# Patient Record
Sex: Female | Born: 1965 | Race: Black or African American | Hispanic: No | Marital: Married | Smoking: Former smoker
Health system: Southern US, Community
[De-identification: ages and names within clinical notes are randomized; demographics above are authoritative.]

## PROBLEM LIST (undated history)

## (undated) ENCOUNTER — Emergency Department (HOSPITAL_COMMUNITY): Admission: EM | Payer: Medicare Other | Source: Home / Self Care

## (undated) DIAGNOSIS — F410 Panic disorder [episodic paroxysmal anxiety] without agoraphobia: Secondary | ICD-10-CM

## (undated) DIAGNOSIS — J449 Chronic obstructive pulmonary disease, unspecified: Secondary | ICD-10-CM

## (undated) DIAGNOSIS — M549 Dorsalgia, unspecified: Secondary | ICD-10-CM

## (undated) DIAGNOSIS — G4730 Sleep apnea, unspecified: Secondary | ICD-10-CM

## (undated) DIAGNOSIS — I10 Essential (primary) hypertension: Secondary | ICD-10-CM

## (undated) DIAGNOSIS — F209 Schizophrenia, unspecified: Secondary | ICD-10-CM

## (undated) DIAGNOSIS — F419 Anxiety disorder, unspecified: Secondary | ICD-10-CM

## (undated) DIAGNOSIS — F32A Depression, unspecified: Secondary | ICD-10-CM

## (undated) DIAGNOSIS — D649 Anemia, unspecified: Secondary | ICD-10-CM

## (undated) DIAGNOSIS — F329 Major depressive disorder, single episode, unspecified: Secondary | ICD-10-CM

## (undated) DIAGNOSIS — G8929 Other chronic pain: Secondary | ICD-10-CM

## (undated) DIAGNOSIS — M1990 Unspecified osteoarthritis, unspecified site: Secondary | ICD-10-CM

## (undated) HISTORY — PX: CHOLECYSTECTOMY: SHX55

## (undated) HISTORY — PX: PARTIAL HYSTERECTOMY: SHX80

## (undated) HISTORY — DX: Sleep apnea, unspecified: G47.30

---

## 2009-10-21 ENCOUNTER — Encounter: Admission: RE | Admit: 2009-10-21 | Discharge: 2009-10-21 | Payer: Self-pay | Admitting: Internal Medicine

## 2010-03-08 ENCOUNTER — Emergency Department (HOSPITAL_COMMUNITY)
Admission: EM | Admit: 2010-03-08 | Discharge: 2010-03-08 | Payer: Self-pay | Source: Home / Self Care | Admitting: Emergency Medicine

## 2010-03-24 ENCOUNTER — Emergency Department (HOSPITAL_COMMUNITY)
Admission: EM | Admit: 2010-03-24 | Discharge: 2010-03-25 | Payer: Self-pay | Source: Home / Self Care | Admitting: Emergency Medicine

## 2010-03-24 LAB — URINALYSIS, ROUTINE W REFLEX MICROSCOPIC
Bilirubin Urine: NEGATIVE
Hemoglobin, Urine: NEGATIVE
Ketones, ur: NEGATIVE mg/dL
Nitrite: NEGATIVE
Protein, ur: NEGATIVE mg/dL
Specific Gravity, Urine: 1.026 (ref 1.005–1.030)
Urine Glucose, Fasting: NEGATIVE mg/dL
Urobilinogen, UA: 0.2 mg/dL (ref 0.0–1.0)
pH: 5.5 (ref 5.0–8.0)

## 2010-03-24 LAB — BASIC METABOLIC PANEL
BUN: 9 mg/dL (ref 6–23)
CO2: 30 mEq/L (ref 19–32)
Calcium: 9.5 mg/dL (ref 8.4–10.5)
Chloride: 102 mEq/L (ref 96–112)
Creatinine, Ser: 0.78 mg/dL (ref 0.4–1.2)
GFR calc Af Amer: >60 mL/min (ref >60)
GFR calc non Af Amer: >60 mL/min (ref >60)
Glucose, Bld: 98 mg/dL (ref 70–99)
Potassium: 3.8 mEq/L (ref 3.5–5.1)
Sodium: 139 mEq/L (ref 135–145)

## 2010-03-24 LAB — CBC
HCT: 36.8 % (ref 36.0–46.0)
Hemoglobin: 12.1 g/dL (ref 12.0–15.0)
MCH: 30.9 pg (ref 26.0–34.0)
MCHC: 32.9 g/dL (ref 30.0–36.0)
MCV: 93.9 fL (ref 78.0–100.0)
Platelets: 277 10*3/uL (ref 150–400)
RBC: 3.92 MIL/uL (ref 3.87–5.11)
RDW: 13.1 % (ref 11.5–15.5)
WBC: 8.2 10*3/uL (ref 4.0–10.5)

## 2010-03-24 LAB — DIFFERENTIAL
Basophils Absolute: 0 10*3/uL (ref 0.0–0.1)
Basophils Relative: 0 % (ref 0–1)
Eosinophils Absolute: 0.1 10*3/uL (ref 0.0–0.7)
Eosinophils Relative: 1 % (ref 0–5)
Lymphocytes Relative: 31 % (ref 12–46)
Lymphs Abs: 2.5 10*3/uL (ref 0.7–4.0)
Monocytes Absolute: 0.6 10*3/uL (ref 0.1–1.0)
Monocytes Relative: 8 % (ref 3–12)
Neutro Abs: 5 10*3/uL (ref 1.7–7.7)
Neutrophils Relative %: 61 % (ref 43–77)

## 2010-03-24 LAB — POCT CARDIAC MARKERS
CKMB, poc: <1 ng/mL — ABNORMAL LOW (ref 1.0–8.0)
Myoglobin, poc: 52.7 ng/mL (ref 12–200)
Troponin i, poc: <0.05 ng/mL (ref 0.00–0.09)

## 2010-05-06 ENCOUNTER — Other Ambulatory Visit (HOSPITAL_COMMUNITY): Payer: Self-pay | Admitting: Internal Medicine

## 2010-05-06 DIAGNOSIS — Z1231 Encounter for screening mammogram for malignant neoplasm of breast: Secondary | ICD-10-CM

## 2010-05-31 LAB — URINALYSIS, ROUTINE W REFLEX MICROSCOPIC
Bilirubin Urine: NEGATIVE
Glucose, UA: NEGATIVE mg/dL
Hgb urine dipstick: NEGATIVE
Ketones, ur: NEGATIVE mg/dL
Nitrite: NEGATIVE
Protein, ur: NEGATIVE mg/dL
Specific Gravity, Urine: 1.024 (ref 1.005–1.030)
Urobilinogen, UA: 0.2 mg/dL (ref 0.0–1.0)
pH: 6 (ref 5.0–8.0)

## 2010-05-31 LAB — GLUCOSE, CAPILLARY: Glucose-Capillary: 106 mg/dL — ABNORMAL HIGH (ref 70–99)

## 2010-06-02 ENCOUNTER — Other Ambulatory Visit: Payer: Self-pay | Admitting: Internal Medicine

## 2010-06-02 DIAGNOSIS — Z1231 Encounter for screening mammogram for malignant neoplasm of breast: Secondary | ICD-10-CM

## 2010-09-17 ENCOUNTER — Emergency Department (HOSPITAL_COMMUNITY): Payer: Medicare Other

## 2010-09-17 ENCOUNTER — Emergency Department (HOSPITAL_COMMUNITY)
Admission: EM | Admit: 2010-09-17 | Discharge: 2010-09-17 | Disposition: A | Discharge status: Home / Self Care | Payer: Medicare Other | Attending: Emergency Medicine | Admitting: Emergency Medicine

## 2010-09-17 DIAGNOSIS — S0083XA Contusion of other part of head, initial encounter: Secondary | ICD-10-CM | POA: Insufficient documentation

## 2010-09-17 DIAGNOSIS — R51 Headache: Secondary | ICD-10-CM | POA: Insufficient documentation

## 2010-09-17 DIAGNOSIS — S0010XA Contusion of unspecified eyelid and periocular area, initial encounter: Secondary | ICD-10-CM | POA: Insufficient documentation

## 2010-09-17 DIAGNOSIS — S0003XA Contusion of scalp, initial encounter: Secondary | ICD-10-CM | POA: Insufficient documentation

## 2010-10-25 ENCOUNTER — Ambulatory Visit: Payer: Self-pay

## 2010-10-26 ENCOUNTER — Other Ambulatory Visit: Payer: Self-pay | Admitting: Internal Medicine

## 2010-10-26 DIAGNOSIS — N644 Mastodynia: Secondary | ICD-10-CM

## 2010-11-01 ENCOUNTER — Ambulatory Visit: Payer: Self-pay

## 2010-11-03 ENCOUNTER — Other Ambulatory Visit: Payer: Medicare Other

## 2010-11-16 ENCOUNTER — Other Ambulatory Visit: Payer: Medicare Other

## 2010-11-30 ENCOUNTER — Other Ambulatory Visit: Payer: Medicare Other

## 2010-12-06 ENCOUNTER — Ambulatory Visit
Admission: RE | Admit: 2010-12-06 | Discharge: 2010-12-06 | Disposition: A | Discharge status: Home / Self Care | Payer: Medicare Other | Source: Ambulatory Visit | Attending: Internal Medicine | Admitting: Internal Medicine

## 2010-12-06 DIAGNOSIS — Z780 Asymptomatic menopausal state: Secondary | ICD-10-CM

## 2010-12-06 DIAGNOSIS — N644 Mastodynia: Secondary | ICD-10-CM

## 2011-04-04 ENCOUNTER — Other Ambulatory Visit (HOSPITAL_COMMUNITY): Payer: Self-pay | Admitting: Internal Medicine

## 2011-04-27 ENCOUNTER — Other Ambulatory Visit (HOSPITAL_COMMUNITY): Payer: Self-pay | Admitting: Internal Medicine

## 2011-04-27 DIAGNOSIS — R1032 Left lower quadrant pain: Secondary | ICD-10-CM

## 2011-04-28 ENCOUNTER — Other Ambulatory Visit (HOSPITAL_COMMUNITY): Payer: Self-pay | Admitting: Internal Medicine

## 2011-04-28 DIAGNOSIS — R1032 Left lower quadrant pain: Secondary | ICD-10-CM

## 2011-05-04 ENCOUNTER — Inpatient Hospital Stay (HOSPITAL_COMMUNITY): Admission: RE | Admit: 2011-05-04 | Payer: Medicare Other | Source: Ambulatory Visit

## 2011-05-04 ENCOUNTER — Other Ambulatory Visit (HOSPITAL_COMMUNITY): Payer: Medicare Other

## 2011-05-11 ENCOUNTER — Other Ambulatory Visit (HOSPITAL_COMMUNITY): Payer: Medicare Other

## 2011-05-11 ENCOUNTER — Inpatient Hospital Stay (HOSPITAL_COMMUNITY): Admission: RE | Admit: 2011-05-11 | Payer: Medicare Other | Source: Ambulatory Visit

## 2011-05-13 ENCOUNTER — Other Ambulatory Visit (HOSPITAL_COMMUNITY): Payer: Medicare Other

## 2011-06-23 ENCOUNTER — Encounter (HOSPITAL_COMMUNITY): Payer: Self-pay | Admitting: *Deleted

## 2011-06-23 ENCOUNTER — Emergency Department (HOSPITAL_COMMUNITY): Payer: Medicare Other

## 2011-06-23 ENCOUNTER — Emergency Department (HOSPITAL_COMMUNITY)
Admission: EM | Admit: 2011-06-23 | Discharge: 2011-06-23 | Disposition: A | Discharge status: Home / Self Care | Payer: Medicare Other | Attending: Emergency Medicine | Admitting: Emergency Medicine

## 2011-06-23 DIAGNOSIS — G8929 Other chronic pain: Secondary | ICD-10-CM | POA: Insufficient documentation

## 2011-06-23 DIAGNOSIS — S32599A Other specified fracture of unspecified pubis, initial encounter for closed fracture: Secondary | ICD-10-CM

## 2011-06-23 DIAGNOSIS — S32509A Unspecified fracture of unspecified pubis, initial encounter for closed fracture: Secondary | ICD-10-CM | POA: Insufficient documentation

## 2011-06-23 DIAGNOSIS — F341 Dysthymic disorder: Secondary | ICD-10-CM | POA: Insufficient documentation

## 2011-06-23 DIAGNOSIS — X58XXXA Exposure to other specified factors, initial encounter: Secondary | ICD-10-CM | POA: Insufficient documentation

## 2011-06-23 DIAGNOSIS — M549 Dorsalgia, unspecified: Secondary | ICD-10-CM | POA: Insufficient documentation

## 2011-06-23 DIAGNOSIS — M25559 Pain in unspecified hip: Secondary | ICD-10-CM | POA: Insufficient documentation

## 2011-06-23 DIAGNOSIS — IMO0002 Reserved for concepts with insufficient information to code with codable children: Secondary | ICD-10-CM | POA: Insufficient documentation

## 2011-06-23 HISTORY — DX: Depression, unspecified: F32.A

## 2011-06-23 HISTORY — DX: Major depressive disorder, single episode, unspecified: F32.9

## 2011-06-23 HISTORY — DX: Panic disorder (episodic paroxysmal anxiety): F41.0

## 2011-06-23 HISTORY — DX: Other chronic pain: G89.29

## 2011-06-23 HISTORY — DX: Unspecified osteoarthritis, unspecified site: M19.90

## 2011-06-23 HISTORY — DX: Dorsalgia, unspecified: M54.9

## 2011-06-23 HISTORY — DX: Anxiety disorder, unspecified: F41.9

## 2011-06-23 MED ORDER — PERCOCET 5-325 MG PO TABS: 1.0000 | ORAL_TABLET | Freq: Four times a day (QID) | ORAL | Status: AC | PRN

## 2011-06-23 MED ORDER — IBUPROFEN 800 MG PO TABS
800.0000 mg | ORAL_TABLET | Freq: Once | ORAL | Status: AC
  Administered 2011-06-23: 800 mg via ORAL
  Filled 2011-06-23: qty 1

## 2011-06-23 NOTE — Progress Notes (Signed)
Pt states pcp is Dr Rolley Sims Unable to find MD listed

## 2011-06-23 NOTE — ED Notes (Signed)
EMS delivered backpack to room
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
## 2011-06-23 NOTE — Discharge Instructions (Signed)
Please read the information below.  Follow up with your primary care provider.  Call the orthopedist if your pain worsens or is not improving with rest and the prescribed pain medication.  You may return to the ER at any time for worsening condition or any new symptoms that concern you.  Pelvic Fracture, Adult You have a fracture (this means there is a break in the bones) of the pelvis. The pelvis is the ring of bones that make up your hipbones. These are the bones you sit on and the lower part of the spine. It is like a boney ring where your legs attach and which supports your upper body. You have an un-displaced fracture. This means the bones are in good position. The pelvic fracture you have is a simple (uncomplicated) fracture. DIAGNOSIS  X-rays usually diagnose these fractures. TREATMENT  The goals of treating pelvic fractures are to get the bones to heal in a good position. The patient should return to normal activities as soon as possible. Such fractures are often treated with normal bed rest and conservative measures.  HOME CARE INSTRUCTIONS   You should be at bed rest for as long as directed by your caregiver. Following this, you may do usual activities, but avoid strenuous activities for as long as directed by your caregiver.   Only take over-the-counter or prescription medicines for pain, discomfort, or fever as directed by your caregiver.   Bed-rest may also be used for discomfort.   Resume your activities when you are able.   If you develop increased pain or discomfort not relieved with medications, contact your caregiver.   Warning: Do not drive a car or operate a motor vehicle until your caregiver specifically tells you it is safe to do so.  SEEK IMMEDIATE MEDICAL CARE IF:   You feel light-headed or faint, develop chest pain or shortness of breath.   An unexplained oral temperature above 102 F (38.9 C) develops.   You develop blood in the urine or in the stools.   There  is difficulty urinating, and/or having a bowel movement, or pain with these efforts.   There is a difficulty or increased pain with walking.   There is swelling in one or both legs that is not normal.  Document Released: 05/16/2001 Document Revised: 02/24/2011 Document Reviewed: 10/19/2007 [facility] Patient Information 2012 [place], [place].

## 2011-06-23 NOTE — Progress Notes (Signed)
Assisted pt to restroom to void.
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
## 2011-06-23 NOTE — ED Notes (Signed)
Patient transported to X-ray 

## 2011-06-23 NOTE — ED Notes (Signed)
PA at bedside. Pt calm.

## 2011-06-25 NOTE — ED Provider Notes (Signed)
Medical screening examination/treatment/procedure(s) were performed by non-physician practitioner and as supervising physician I was immediately available for consultation/collaboration.   Gavin Pound. Kaydyn Chism, MD 06/25/11 1031

## 2012-02-05 ENCOUNTER — Emergency Department (HOSPITAL_COMMUNITY): Payer: Medicare Other

## 2012-02-05 ENCOUNTER — Encounter (HOSPITAL_COMMUNITY): Payer: Self-pay | Admitting: Emergency Medicine

## 2012-02-05 ENCOUNTER — Emergency Department (HOSPITAL_COMMUNITY)
Admission: EM | Admit: 2012-02-05 | Discharge: 2012-02-05 | Disposition: A | Discharge status: Home / Self Care | Payer: Medicare Other | Attending: Emergency Medicine | Admitting: Emergency Medicine

## 2012-02-05 DIAGNOSIS — M542 Cervicalgia: Secondary | ICD-10-CM | POA: Insufficient documentation

## 2012-02-05 DIAGNOSIS — S00531A Contusion of lip, initial encounter: Secondary | ICD-10-CM

## 2012-02-05 DIAGNOSIS — F410 Panic disorder [episodic paroxysmal anxiety] without agoraphobia: Secondary | ICD-10-CM | POA: Insufficient documentation

## 2012-02-05 DIAGNOSIS — S0003XA Contusion of scalp, initial encounter: Secondary | ICD-10-CM | POA: Insufficient documentation

## 2012-02-05 DIAGNOSIS — F411 Generalized anxiety disorder: Secondary | ICD-10-CM | POA: Insufficient documentation

## 2012-02-05 DIAGNOSIS — M129 Arthropathy, unspecified: Secondary | ICD-10-CM | POA: Insufficient documentation

## 2012-02-05 DIAGNOSIS — F329 Major depressive disorder, single episode, unspecified: Secondary | ICD-10-CM | POA: Insufficient documentation

## 2012-02-05 DIAGNOSIS — R51 Headache: Secondary | ICD-10-CM | POA: Insufficient documentation

## 2012-02-05 DIAGNOSIS — J029 Acute pharyngitis, unspecified: Secondary | ICD-10-CM | POA: Insufficient documentation

## 2012-02-05 DIAGNOSIS — Z23 Encounter for immunization: Secondary | ICD-10-CM | POA: Insufficient documentation

## 2012-02-05 DIAGNOSIS — Z79899 Other long term (current) drug therapy: Secondary | ICD-10-CM | POA: Insufficient documentation

## 2012-02-05 DIAGNOSIS — F3289 Other specified depressive episodes: Secondary | ICD-10-CM | POA: Insufficient documentation

## 2012-02-05 DIAGNOSIS — S0990XA Unspecified injury of head, initial encounter: Secondary | ICD-10-CM

## 2012-02-05 DIAGNOSIS — T07XXXA Unspecified multiple injuries, initial encounter: Secondary | ICD-10-CM

## 2012-02-05 DIAGNOSIS — Z87891 Personal history of nicotine dependence: Secondary | ICD-10-CM | POA: Insufficient documentation

## 2012-02-05 DIAGNOSIS — S1093XA Contusion of unspecified part of neck, initial encounter: Secondary | ICD-10-CM | POA: Insufficient documentation

## 2012-02-05 DIAGNOSIS — G8929 Other chronic pain: Secondary | ICD-10-CM | POA: Insufficient documentation

## 2012-02-05 DIAGNOSIS — M549 Dorsalgia, unspecified: Secondary | ICD-10-CM | POA: Insufficient documentation

## 2012-02-05 DIAGNOSIS — F209 Schizophrenia, unspecified: Secondary | ICD-10-CM | POA: Insufficient documentation

## 2012-02-05 DIAGNOSIS — IMO0002 Reserved for concepts with insufficient information to code with codable children: Secondary | ICD-10-CM | POA: Insufficient documentation

## 2012-02-05 HISTORY — DX: Schizophrenia, unspecified: F20.9

## 2012-02-05 MED ORDER — TETANUS-DIPHTH-ACELL PERTUSSIS 5-2.5-18.5 LF-MCG/0.5 IM SUSP
0.5000 mL | Freq: Once | INTRAMUSCULAR | Status: AC
  Administered 2012-02-05: 0.5 mL via INTRAMUSCULAR
  Filled 2012-02-05: qty 0.5

## 2012-02-05 MED ORDER — TRAMADOL HCL 50 MG PO TABS
50.0000 mg | ORAL_TABLET | Freq: Once | ORAL | Status: AC
  Administered 2012-02-05: 50 mg via ORAL
  Filled 2012-02-05: qty 1
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
## 2012-02-06 NOTE — ED Provider Notes (Signed)
Medical screening examination/treatment/procedure(s) were performed by non-physician practitioner and as supervising physician I was immediately available for consultation/collaboration.  Shadavia Dampier R. Dontay Harm, MD 02/06/12 2032 

## 2012-03-09 ENCOUNTER — Encounter (HOSPITAL_COMMUNITY): Payer: Self-pay | Admitting: Emergency Medicine

## 2012-03-09 ENCOUNTER — Emergency Department (HOSPITAL_COMMUNITY)
Admission: EM | Admit: 2012-03-09 | Discharge: 2012-03-09 | Disposition: A | Discharge status: Home / Self Care | Payer: Medicare Other | Attending: Emergency Medicine | Admitting: Emergency Medicine

## 2012-03-09 ENCOUNTER — Emergency Department (HOSPITAL_COMMUNITY): Payer: Medicare Other

## 2012-03-09 DIAGNOSIS — Z8659 Personal history of other mental and behavioral disorders: Secondary | ICD-10-CM | POA: Insufficient documentation

## 2012-03-09 DIAGNOSIS — Z3202 Encounter for pregnancy test, result negative: Secondary | ICD-10-CM | POA: Insufficient documentation

## 2012-03-09 DIAGNOSIS — Z87891 Personal history of nicotine dependence: Secondary | ICD-10-CM | POA: Insufficient documentation

## 2012-03-09 DIAGNOSIS — Z8739 Personal history of other diseases of the musculoskeletal system and connective tissue: Secondary | ICD-10-CM | POA: Insufficient documentation

## 2012-03-09 DIAGNOSIS — N83202 Unspecified ovarian cyst, left side: Secondary | ICD-10-CM

## 2012-03-09 DIAGNOSIS — N83209 Unspecified ovarian cyst, unspecified side: Secondary | ICD-10-CM | POA: Insufficient documentation

## 2012-03-09 DIAGNOSIS — M25569 Pain in unspecified knee: Secondary | ICD-10-CM | POA: Insufficient documentation

## 2012-03-09 DIAGNOSIS — F411 Generalized anxiety disorder: Secondary | ICD-10-CM | POA: Insufficient documentation

## 2012-03-09 DIAGNOSIS — Z79899 Other long term (current) drug therapy: Secondary | ICD-10-CM | POA: Insufficient documentation

## 2012-03-09 DIAGNOSIS — G8929 Other chronic pain: Secondary | ICD-10-CM | POA: Insufficient documentation

## 2012-03-09 DIAGNOSIS — F209 Schizophrenia, unspecified: Secondary | ICD-10-CM | POA: Insufficient documentation

## 2012-03-09 LAB — URINALYSIS, ROUTINE W REFLEX MICROSCOPIC
Bilirubin Urine: NEGATIVE
Nitrite: NEGATIVE
Specific Gravity, Urine: 1.026 (ref 1.005–1.030)
Urobilinogen, UA: 0.2 mg/dL (ref 0.0–1.0)
pH: 5 (ref 5.0–8.0)

## 2012-03-09 LAB — WET PREP, GENITAL
Trich, Wet Prep: NONE SEEN
WBC, Wet Prep HPF POC: NONE SEEN
Yeast Wet Prep HPF POC: NONE SEEN

## 2012-03-09 LAB — URINE MICROSCOPIC-ADD ON

## 2012-03-09 MED ORDER — MELOXICAM 7.5 MG PO TABS: 7.5000 mg | ORAL_TABLET | Freq: Every day | ORAL | Status: DC

## 2012-03-09 NOTE — ED Provider Notes (Signed)
History     CSN: [id number]  Arrival date & time 03/09/12  0612   First MD Initiated Contact with Patient 03/09/12 [phone]      Chief Complaint  Patient presents with  . Abdominal Pain    (Consider location/radiation/quality/duration/timing/severity/associated sxs/prior treatment) Patient is a 46 y.o. female presenting with abdominal pain. The history is provided by the patient. No language interpreter was used.  Abdominal Pain The primary symptoms of the illness include abdominal pain. Episode onset: 20. The onset of the illness was gradual. The problem has been gradually worsening.  The patient states that she believes she is currently not pregnant. Symptoms associated with the illness do not include chills or back pain.   Pt reports she has pain in left lower abdominal pain.   Pt reports she has had pain since 1984 on and off.   Pt wants ovaries removed.   Pt also reports she has arthritis in her knees Past Medical History  Diagnosis Date  . Arthritis   . Chronic back pain   . Depression   . Anxiety   . Panic attacks   . Schizophrenia     Past Surgical History  Procedure Date  . Cesarean section   . Partial hysterectomy   . Cholecystectomy     No family history on file.  History  Substance Use Topics  . Smoking status: Former Smoker -- 0.0 packs/day    Types: Cigarettes    Quit date: 02/03/2012  . Smokeless tobacco: Never Used  . Alcohol Use: No    OB History    Grav Para Term Preterm Abortions TAB SAB Ect Mult Living                  Review of Systems  Constitutional: Negative for chills.  Gastrointestinal: Positive for abdominal pain.  Musculoskeletal: Negative for back pain.  All other systems reviewed and are negative.    Allergies  Sulfa antibiotics  Home Medications   Current Outpatient Rx  Name  Route  Sig  Dispense  Refill  . QUETIAPINE FUMARATE 100 MG PO TABS   Oral   Take 100 mg by mouth at bedtime.         . TRAZODONE HCL 100  MG PO TABS   Oral   Take 100-200 mg by mouth at bedtime.           BP 127/66  Pulse 76  Temp 98.7 F (37.1 C) (Oral)  Resp 18  SpO2 98%  Physical Exam  Nursing note and vitals reviewed. Constitutional: She is oriented to person, place, and time. She appears well-developed and well-nourished.  HENT:  Head: Normocephalic and atraumatic.  Right Ear: External ear normal.  Left Ear: External ear normal.  Nose: Nose normal.  Mouth/Throat: Oropharynx is clear and moist.  Eyes: Conjunctivae normal are normal. Pupils are equal, round, and reactive to light.  Neck: Normal range of motion. Neck supple.  Cardiovascular: Normal rate and normal heart sounds.   Pulmonary/Chest: Effort normal and breath sounds normal.  Abdominal: Soft. There is tenderness.  Genitourinary: Uterus normal. No vaginal discharge found.       Tender left adnexa  Musculoskeletal: She exhibits tenderness.       bilat knees  No swelling  Neurological: She is alert and oriented to person, place, and time. She has normal reflexes.  Skin: Skin is warm.  Psychiatric: She has a normal mood and affect.    ED Course  Procedures (including critical care time)  Labs Reviewed  URINALYSIS, ROUTINE W REFLEX MICROSCOPIC - Abnormal; Notable for the following:    APPearance CLOUDY (*)     Hgb urine dipstick LARGE (*)     Leukocytes, UA MODERATE (*)     All other components within normal limits  URINE MICROSCOPIC-ADD ON - Abnormal; Notable for the following:    Squamous Epithelial / LPF FEW (*)     Bacteria, UA FEW (*)     All other components within normal limits  PREGNANCY, URINE  WET PREP, GENITAL  GC/CHLAMYDIA PROBE AMP  URINE CULTURE   US Transvaginal Non-ob  03/09/2012  *RADIOLOGY REPORT*  Clinical Data: Left lower quadrant pain. Uterus removed.  Previous cyst.  TRANSABDOMINAL AND TRANSVAGINAL ULTRASOUND OF PELVIS Technique:  Both transabdominal and transvaginal ultrasound examinations of the pelvis were  performed. Transabdominal technique was performed for global imaging of the pelvis including uterus, ovaries, adnexal regions, and pelvic cul-de-sac.  It was necessary to proceed with endovaginal exam following the transabdominal exam to visualize the adnexa.  Comparison:  None  Findings:  Uterus: Removed.  Calcification vaginal cuff.  Right ovary:  3.1 x 2.0 x 1.7 cm.  No dominant mass.  Left ovary: 5.0 x 3.2 x 3.6 cm.  Two cysts/follicles measuring up to 2.2 cm.  Other findings: Trace  IMPRESSION: Post hysterectomy.  Calcification of the vaginal cuff incidentally noted.  No worrisome adnexal abnormality.  Left ovary contains two small cysts or follicles.   Original Report Authenticated By: Lacy Duverney, M.D.    US Pelvis Complete  03/09/2012  *RADIOLOGY REPORT*  Clinical Data: Left lower quadrant pain. Uterus removed.  Previous cyst.  TRANSABDOMINAL AND TRANSVAGINAL ULTRASOUND OF PELVIS Technique:  Both transabdominal and transvaginal ultrasound examinations of the pelvis were performed. Transabdominal technique was performed for global imaging of the pelvis including uterus, ovaries, adnexal regions, and pelvic cul-de-sac.  It was necessary to proceed with endovaginal exam following the transabdominal exam to visualize the adnexa.  Comparison:  None  Findings:  Uterus: Removed.  Calcification vaginal cuff.  Right ovary:  3.1 x 2.0 x 1.7 cm.  No dominant mass.  Left ovary: 5.0 x 3.2 x 3.6 cm.  Two cysts/follicles measuring up to 2.2 cm.  Other findings: Trace  IMPRESSION: Post hysterectomy.  Calcification of the vaginal cuff incidentally noted.  No worrisome adnexal abnormality.  Left ovary contains two small cysts or follicles.   Original Report Authenticated By: Lacy Duverney, M.D.      1. Left ovarian cyst   2. Bilateral chronic knee pain       MDM  Meloxicam,   Referral to gyn clinic for evaluation        Lonia Skinner [place], [place] 03/09/12 [phone]

## 2012-03-09 NOTE — ED Notes (Signed)
Pt brought to ED by [facility] with abdominal pain mainly on the rt side.

## 2012-03-09 NOTE — ED Notes (Signed)
Pt returned from US

## 2012-03-09 NOTE — ED Notes (Signed)
Pt brought to ED by [facility] with abdominal pain more on the rt side.

## 2012-03-09 NOTE — ED Provider Notes (Signed)
Medical screening examination/treatment/procedure(s) were performed by non-physician practitioner and as supervising physician I was immediately available for consultation/collaboration.  Leslee Home, M.D.   Reuben Likes, MD 03/09/12 [phone]

## 2012-03-09 NOTE — ED Notes (Signed)
Pt transported to US

## 2012-03-09 NOTE — ED Notes (Signed)
Pt c/o lump on the back of rt side of neck, says its painful. Small nodule noted.

## 2012-03-10 LAB — URINE CULTURE
Colony Count: NO GROWTH
Culture: NO GROWTH

## 2012-03-10 LAB — GC/CHLAMYDIA PROBE AMP
CT Probe RNA: NEGATIVE
GC Probe RNA: NEGATIVE

## 2012-03-30 ENCOUNTER — Ambulatory Visit: Payer: Medicare Other | Admitting: Family Medicine

## 2012-04-03 ENCOUNTER — Ambulatory Visit (HOSPITAL_COMMUNITY)
Admission: RE | Admit: 2012-04-03 | Discharge: 2012-04-03 | Disposition: A | Discharge status: Home / Self Care | Payer: Medicare Other | Source: Ambulatory Visit | Attending: Family Medicine | Admitting: Family Medicine

## 2012-04-03 ENCOUNTER — Other Ambulatory Visit: Payer: Self-pay | Admitting: Internal Medicine

## 2012-04-03 ENCOUNTER — Encounter: Payer: Self-pay | Admitting: Family Medicine

## 2012-04-03 ENCOUNTER — Ambulatory Visit (INDEPENDENT_AMBULATORY_CARE_PROVIDER_SITE_OTHER): Payer: Medicare Other | Admitting: Family Medicine

## 2012-04-03 VITALS — BP 133/82 | HR 120 | Temp 98.3°F | Ht 62.5 in | Wt 267.0 lb

## 2012-04-03 DIAGNOSIS — Z8669 Personal history of other diseases of the nervous system and sense organs: Secondary | ICD-10-CM | POA: Insufficient documentation

## 2012-04-03 DIAGNOSIS — M1710 Unilateral primary osteoarthritis, unspecified knee: Secondary | ICD-10-CM

## 2012-04-03 DIAGNOSIS — Z1231 Encounter for screening mammogram for malignant neoplasm of breast: Secondary | ICD-10-CM

## 2012-04-03 DIAGNOSIS — R079 Chest pain, unspecified: Secondary | ICD-10-CM | POA: Insufficient documentation

## 2012-04-03 DIAGNOSIS — F419 Anxiety disorder, unspecified: Secondary | ICD-10-CM | POA: Insufficient documentation

## 2012-04-03 DIAGNOSIS — R0789 Other chest pain: Secondary | ICD-10-CM

## 2012-04-03 DIAGNOSIS — F209 Schizophrenia, unspecified: Secondary | ICD-10-CM

## 2012-04-03 DIAGNOSIS — Z0000 Encounter for general adult medical examination without abnormal findings: Secondary | ICD-10-CM

## 2012-04-03 DIAGNOSIS — M170 Bilateral primary osteoarthritis of knee: Secondary | ICD-10-CM | POA: Insufficient documentation

## 2012-04-03 DIAGNOSIS — F341 Dysthymic disorder: Secondary | ICD-10-CM

## 2012-04-03 DIAGNOSIS — Z6841 Body Mass Index (BMI) 40.0 and over, adult: Secondary | ICD-10-CM

## 2012-04-03 DIAGNOSIS — M79602 Pain in left arm: Secondary | ICD-10-CM | POA: Insufficient documentation

## 2012-04-03 DIAGNOSIS — G8929 Other chronic pain: Secondary | ICD-10-CM | POA: Insufficient documentation

## 2012-04-03 DIAGNOSIS — M79609 Pain in unspecified limb: Secondary | ICD-10-CM

## 2012-04-03 DIAGNOSIS — F32A Depression, unspecified: Secondary | ICD-10-CM | POA: Insufficient documentation

## 2012-04-03 DIAGNOSIS — R519 Headache, unspecified: Secondary | ICD-10-CM | POA: Insufficient documentation

## 2012-04-03 DIAGNOSIS — Z79899 Other long term (current) drug therapy: Secondary | ICD-10-CM

## 2012-04-03 DIAGNOSIS — R51 Headache: Secondary | ICD-10-CM

## 2012-04-03 DIAGNOSIS — G4730 Sleep apnea, unspecified: Secondary | ICD-10-CM

## 2012-04-03 DIAGNOSIS — F329 Major depressive disorder, single episode, unspecified: Secondary | ICD-10-CM | POA: Insufficient documentation

## 2012-04-03 MED ORDER — DICLOFENAC SODIUM 1 % TD GEL: 4.0000 g | Freq: Four times a day (QID) | TRANSDERMAL | Status: DC

## 2012-04-03 NOTE — Assessment & Plan Note (Signed)
Mammogram information given
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
## 2012-04-03 NOTE — Assessment & Plan Note (Signed)
Prescribed Voltaren gel for osteoarthritis.

## 2012-04-03 NOTE — Patient Instructions (Addendum)
It was nice meeting you today.  I am getting lab work.  You need to make an appointment for your lab work as the lab is currently closed.  In regards to your headaches/dizziness: I'm unsure of the underlying etiology at this time.  Please follow up with me regarding this problem  In regards to your chest discomfort and left arm pain: I am getting an EKG.  When I see you back we will talk about cardiology referral.  Please set up your mammogram appointment.  Please follow up with me in 1-2 months.

## 2012-04-03 NOTE — Assessment & Plan Note (Addendum)
Patient's reports of chest pain are consistent with MSK origin.  Does not appear cardiac in origin (is not substernal, not associated with exertion, or relieved by rest).  Patient is a poor historian and has multiple somatic complaints as well.    EKG was obtained today - NSR with no ST or t wave changes.  Will continue to follow. Patient requesting Cardiology referral.

## 2012-04-05 ENCOUNTER — Other Ambulatory Visit: Payer: Medicare Other

## 2012-04-05 DIAGNOSIS — Z79899 Other long term (current) drug therapy: Secondary | ICD-10-CM

## 2012-04-05 LAB — CBC
MCH: 30.2 pg (ref 26.0–34.0)
MCV: 90.3 fL (ref 78.0–100.0)
Platelets: 336 10*3/uL (ref 150–400)
RDW: 13.1 % (ref 11.5–15.5)
WBC: 6.7 10*3/uL (ref 4.0–10.5)

## 2012-04-05 NOTE — Progress Notes (Signed)
CMP,CBC AND D-LDL DONE TODAY Charle Clear 

## 2012-04-06 LAB — COMPLETE METABOLIC PANEL WITH GFR
ALT: 12 U/L (ref 0–35)
AST: 17 U/L (ref 0–37)
Creat: 0.62 mg/dL (ref 0.50–1.10)
Sodium: 137 mEq/L (ref 135–145)
Total Bilirubin: 0.3 mg/dL (ref 0.3–1.2)
Total Protein: 6.9 g/dL (ref 6.0–8.3)

## 2012-04-20 ENCOUNTER — Encounter (HOSPITAL_COMMUNITY): Payer: Self-pay

## 2012-04-20 ENCOUNTER — Emergency Department (HOSPITAL_COMMUNITY)
Admission: EM | Admit: 2012-04-20 | Discharge: 2012-04-20 | Disposition: A | Discharge status: Home / Self Care | Payer: Medicare Other | Attending: Emergency Medicine | Admitting: Emergency Medicine

## 2012-04-20 DIAGNOSIS — F209 Schizophrenia, unspecified: Secondary | ICD-10-CM | POA: Insufficient documentation

## 2012-04-20 DIAGNOSIS — R0600 Dyspnea, unspecified: Secondary | ICD-10-CM

## 2012-04-20 DIAGNOSIS — G4730 Sleep apnea, unspecified: Secondary | ICD-10-CM | POA: Insufficient documentation

## 2012-04-20 DIAGNOSIS — F411 Generalized anxiety disorder: Secondary | ICD-10-CM | POA: Insufficient documentation

## 2012-04-20 DIAGNOSIS — Z79899 Other long term (current) drug therapy: Secondary | ICD-10-CM | POA: Insufficient documentation

## 2012-04-20 DIAGNOSIS — R5381 Other malaise: Secondary | ICD-10-CM | POA: Insufficient documentation

## 2012-04-20 DIAGNOSIS — R0609 Other forms of dyspnea: Secondary | ICD-10-CM | POA: Insufficient documentation

## 2012-04-20 DIAGNOSIS — Z87820 Personal history of traumatic brain injury: Secondary | ICD-10-CM | POA: Insufficient documentation

## 2012-04-20 DIAGNOSIS — Z87891 Personal history of nicotine dependence: Secondary | ICD-10-CM | POA: Insufficient documentation

## 2012-04-20 DIAGNOSIS — Z8739 Personal history of other diseases of the musculoskeletal system and connective tissue: Secondary | ICD-10-CM | POA: Insufficient documentation

## 2012-04-20 DIAGNOSIS — F3289 Other specified depressive episodes: Secondary | ICD-10-CM | POA: Insufficient documentation

## 2012-04-20 DIAGNOSIS — R0989 Other specified symptoms and signs involving the circulatory and respiratory systems: Secondary | ICD-10-CM | POA: Insufficient documentation

## 2012-04-20 DIAGNOSIS — F329 Major depressive disorder, single episode, unspecified: Secondary | ICD-10-CM | POA: Insufficient documentation

## 2012-04-20 DIAGNOSIS — M129 Arthropathy, unspecified: Secondary | ICD-10-CM | POA: Insufficient documentation

## 2012-04-20 DIAGNOSIS — R42 Dizziness and giddiness: Secondary | ICD-10-CM | POA: Insufficient documentation

## 2012-04-20 DIAGNOSIS — G479 Sleep disorder, unspecified: Secondary | ICD-10-CM | POA: Insufficient documentation

## 2012-04-20 LAB — CBC WITH DIFFERENTIAL/PLATELET
Eosinophils Relative: 1 % (ref 0–5)
HCT: 34 % — ABNORMAL LOW (ref 36.0–46.0)
Hemoglobin: 11.2 g/dL — ABNORMAL LOW (ref 12.0–15.0)
Lymphocytes Relative: 39 % (ref 12–46)
Lymphs Abs: 2.1 10*3/uL (ref 0.7–4.0)
MCV: 92.6 fL (ref 78.0–100.0)
Monocytes Absolute: 0.4 10*3/uL (ref 0.1–1.0)
Monocytes Relative: 7 % (ref 3–12)
Platelets: 291 10*3/uL (ref 150–400)
RBC: 3.67 MIL/uL — ABNORMAL LOW (ref 3.87–5.11)
WBC: 5.3 10*3/uL (ref 4.0–10.5)

## 2012-04-20 LAB — BASIC METABOLIC PANEL
BUN: 10 mg/dL (ref 6–23)
CO2: 25 mEq/L (ref 19–32)
Calcium: 9 mg/dL (ref 8.4–10.5)
Glucose, Bld: 89 mg/dL (ref 70–99)
Sodium: 139 mEq/L (ref 135–145)

## 2012-04-20 MED ORDER — QUETIAPINE FUMARATE 100 MG PO TABS: 100.0000 mg | ORAL_TABLET | Freq: Every day | ORAL | Status: DC

## 2012-04-20 MED ORDER — SODIUM CHLORIDE 0.9 % IV BOLUS (SEPSIS)
1000.0000 mL | Freq: Once | INTRAVENOUS | Status: AC
  Administered 2012-04-20: 1000 mL via INTRAVENOUS

## 2012-04-20 NOTE — ED Notes (Signed)
Pt. oob to the bathroom, gait steady. Pt. Wants to leave to go to school. Informed Dr. Jeraldine Loots.
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
## 2012-04-24 ENCOUNTER — Ambulatory Visit (INDEPENDENT_AMBULATORY_CARE_PROVIDER_SITE_OTHER): Payer: Medicare Other | Admitting: Family Medicine

## 2012-04-24 ENCOUNTER — Other Ambulatory Visit (HOSPITAL_COMMUNITY)
Admission: RE | Admit: 2012-04-24 | Discharge: 2012-04-24 | Disposition: A | Discharge status: Home / Self Care | Payer: Medicare Other | Source: Ambulatory Visit | Attending: Family Medicine | Admitting: Family Medicine

## 2012-04-24 ENCOUNTER — Encounter: Payer: Self-pay | Admitting: Family Medicine

## 2012-04-24 VITALS — BP 127/87 | HR 87 | Temp 96.9°F | Wt 259.9 lb

## 2012-04-24 DIAGNOSIS — Z202 Contact with and (suspected) exposure to infections with a predominantly sexual mode of transmission: Secondary | ICD-10-CM

## 2012-04-24 DIAGNOSIS — Z0000 Encounter for general adult medical examination without abnormal findings: Secondary | ICD-10-CM

## 2012-04-24 DIAGNOSIS — Z124 Encounter for screening for malignant neoplasm of cervix: Secondary | ICD-10-CM

## 2012-04-24 DIAGNOSIS — Z20828 Contact with and (suspected) exposure to other viral communicable diseases: Secondary | ICD-10-CM

## 2012-04-24 DIAGNOSIS — Z113 Encounter for screening for infections with a predominantly sexual mode of transmission: Secondary | ICD-10-CM | POA: Insufficient documentation

## 2012-04-24 DIAGNOSIS — Z1151 Encounter for screening for human papillomavirus (HPV): Secondary | ICD-10-CM | POA: Insufficient documentation

## 2012-04-24 DIAGNOSIS — Z2089 Contact with and (suspected) exposure to other communicable diseases: Secondary | ICD-10-CM

## 2012-04-24 DIAGNOSIS — N760 Acute vaginitis: Secondary | ICD-10-CM

## 2012-04-24 LAB — POCT WET PREP (WET MOUNT)

## 2012-04-24 MED ORDER — METRONIDAZOLE 500 MG PO TABS: 500.0000 mg | ORAL_TABLET | Freq: Two times a day (BID) | ORAL | Status: DC

## 2012-04-24 NOTE — Patient Instructions (Addendum)
I will call/or mail you a letter with the results of your STD testing.  Please return for the remainder of your lab tests at your earliest convenience (the lab closes after 5 pm).  We will address some of your concerns at your next visit.  Please see me for follow up in 1-3 months.
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
## 2012-04-24 NOTE — Assessment & Plan Note (Signed)
Pap smear performed today.  Patient has had prior hysterectomy but given unclear history, pap smear obtained today.  Will obtain old records and further discuss history of cervical cancer with patient.   If negative, no need for further pap smears.

## 2012-04-24 NOTE — Assessment & Plan Note (Addendum)
Wet prep obtained - positive for BV and Trichomonas.  Called patient and informed her of diagnosis.  Also sent in prescription for 7 day course of flagyl. Awaiting GC and chlamydia. Patient to return for HIV and RPR

## 2012-04-26 ENCOUNTER — Encounter: Payer: Medicare Other | Admitting: Obstetrics & Gynecology

## 2012-04-29 ENCOUNTER — Emergency Department (HOSPITAL_COMMUNITY)
Admission: EM | Admit: 2012-04-29 | Discharge: 2012-04-29 | Disposition: A | Discharge status: Home / Self Care | Payer: Medicare Other | Attending: Emergency Medicine | Admitting: Emergency Medicine

## 2012-04-29 ENCOUNTER — Encounter (HOSPITAL_COMMUNITY): Payer: Self-pay | Admitting: Emergency Medicine

## 2012-04-29 DIAGNOSIS — Z79899 Other long term (current) drug therapy: Secondary | ICD-10-CM | POA: Insufficient documentation

## 2012-04-29 DIAGNOSIS — E669 Obesity, unspecified: Secondary | ICD-10-CM | POA: Insufficient documentation

## 2012-04-29 DIAGNOSIS — Z8739 Personal history of other diseases of the musculoskeletal system and connective tissue: Secondary | ICD-10-CM | POA: Insufficient documentation

## 2012-04-29 DIAGNOSIS — F209 Schizophrenia, unspecified: Secondary | ICD-10-CM | POA: Insufficient documentation

## 2012-04-29 DIAGNOSIS — R42 Dizziness and giddiness: Secondary | ICD-10-CM | POA: Insufficient documentation

## 2012-04-29 DIAGNOSIS — R51 Headache: Secondary | ICD-10-CM | POA: Insufficient documentation

## 2012-04-29 DIAGNOSIS — F411 Generalized anxiety disorder: Secondary | ICD-10-CM | POA: Insufficient documentation

## 2012-04-29 DIAGNOSIS — Z87891 Personal history of nicotine dependence: Secondary | ICD-10-CM | POA: Insufficient documentation

## 2012-04-29 DIAGNOSIS — M1710 Unilateral primary osteoarthritis, unspecified knee: Secondary | ICD-10-CM | POA: Insufficient documentation

## 2012-04-29 DIAGNOSIS — R5381 Other malaise: Secondary | ICD-10-CM | POA: Insufficient documentation

## 2012-04-29 DIAGNOSIS — G4730 Sleep apnea, unspecified: Secondary | ICD-10-CM | POA: Insufficient documentation

## 2012-04-29 LAB — CBC WITH DIFFERENTIAL/PLATELET
Eosinophils Relative: 2 % (ref 0–5)
HCT: 34.7 % — ABNORMAL LOW (ref 36.0–46.0)
Hemoglobin: 11.3 g/dL — ABNORMAL LOW (ref 12.0–15.0)
Lymphocytes Relative: 40 % (ref 12–46)
Lymphs Abs: 2 10*3/uL (ref 0.7–4.0)
MCV: 92 fL (ref 78.0–100.0)
Monocytes Absolute: 0.4 10*3/uL (ref 0.1–1.0)
Neutro Abs: 2.6 10*3/uL (ref 1.7–7.7)
RBC: 3.77 MIL/uL — ABNORMAL LOW (ref 3.87–5.11)
WBC: 5.1 10*3/uL (ref 4.0–10.5)

## 2012-04-29 LAB — COMPREHENSIVE METABOLIC PANEL
ALT: 16 U/L (ref 0–35)
CO2: 28 mEq/L (ref 19–32)
Calcium: 8.6 mg/dL (ref 8.4–10.5)
Chloride: 109 mEq/L (ref 96–112)
Creatinine, Ser: 0.66 mg/dL (ref 0.50–1.10)
GFR calc Af Amer: >90 mL/min (ref >90)
GFR calc non Af Amer: >90 mL/min (ref >90)
Glucose, Bld: 86 mg/dL (ref 70–99)
Total Bilirubin: 0.2 mg/dL — ABNORMAL LOW (ref 0.3–1.2)

## 2012-04-29 MED ORDER — MECLIZINE HCL 25 MG PO TABS
25.0000 mg | ORAL_TABLET | Freq: Once | ORAL | Status: AC
  Administered 2012-04-29: 25 mg via ORAL
  Filled 2012-04-29: qty 1

## 2012-04-29 MED ORDER — MECLIZINE HCL 25 MG PO TABS: 25.0000 mg | ORAL_TABLET | Freq: Three times a day (TID) | ORAL | Status: DC | PRN

## 2012-04-29 NOTE — ED Notes (Signed)
Pt has been seen here recently for weakness/dizziness and rcvd negative head CT.  Pt was given a refill of seroquil on Fri and states she continues to fill dizzy.  She thinks maybe she needs her seroquil prescription to be changed (feels as if dizziness/weakness began when she started taking seroquil and has gotten worse when she was off seroquil and has not improved since she started taking it again).  Pt requesting food and told to await MD.

## 2012-04-29 NOTE — ED Provider Notes (Signed)
History     CSN: [id number]  Arrival date & time 04/29/12  1047   First MD Initiated Contact with Patient 04/29/12 1243      Chief Complaint  Patient presents with  . Dizziness  . Weakness    (Consider location/radiation/quality/duration/timing/severity/associated sxs/prior treatment) HPI Comments: Patient with history of schizophrenia, obesity.  Presents with complaints of headache and dizziness intermittently for the past year, worse the past two weeks.  She was started on seroquel to help her sleep about the same time.  She ran out of this two weeks ago and has felt more dizzy and having headaches since that time.  She was started back on it two days ago.  No chest pain or shortness of breath.  She has been seen for this before and had workups which have been unremarkable.   Patient is a 47 y.o. female presenting with weakness. The history is provided by the patient.  Weakness This is a chronic problem. Episode onset: several months ago. The problem occurs constantly. The problem has been gradually worsening. Associated symptoms include headaches. Pertinent negatives include no chest pain, no abdominal pain and no shortness of breath. Nothing aggravates the symptoms. Nothing relieves the symptoms. She has tried nothing for the symptoms.    Past Medical History  Diagnosis Date  . Arthritis   . Chronic back pain   . Depression   . Anxiety   . Panic attacks   . Schizophrenia   . Sleep apnea     Past Surgical History  Procedure Laterality Date  . Cesarean section    . Partial hysterectomy    . Cholecystectomy      Family History  Problem Relation Age of Onset  . Alcohol abuse Father   . Heart disease Father   . Diabetes Mellitus II Brother   . Diabetes Mellitus II Father   . Stroke Sister   . Osteoporosis Mother     History  Substance Use Topics  . Smoking status: Former Smoker -- 0.02 packs/day    Types: Cigarettes    Quit date: 02/03/2012  . Smokeless tobacco:  Never Used  . Alcohol Use: No    OB History   Grav Para Term Preterm Abortions TAB SAB Ect Mult Living                  Review of Systems  Constitutional: Positive for fatigue. Negative for appetite change.  Respiratory: Negative for shortness of breath.   Cardiovascular: Negative for chest pain.  Gastrointestinal: Negative for abdominal pain.  Neurological: Positive for weakness and headaches.  All other systems reviewed and are negative.    Allergies  Sulfa antibiotics  Home Medications   Current Outpatient Rx  Name  Route  Sig  Dispense  Refill  . diclofenac sodium (VOLTAREN) 1 % GEL   Topical   Apply 4 g topically 4 (four) times daily. For knee pain         . meloxicam (MOBIC) 7.5 MG tablet   Oral   Take 7.5 mg by mouth daily as needed. For pain and inflammation         . metroNIDAZOLE (FLAGYL) 500 MG tablet   Oral   Take 500 mg by mouth 2 (two) times daily. Started course today         . QUEtiapine (SEROQUEL) 100 MG tablet   Oral   Take 100 mg by mouth at bedtime.           BP 145/128  Pulse 69  Temp(Src) 97.9 F (36.6 C) (Oral)  Resp 19  SpO2 98%  Physical Exam  Nursing note and vitals reviewed. Constitutional: She is oriented to person, place, and time. She appears well-developed and well-nourished.  Obese female in no distress.   HENT:  Head: Normocephalic and atraumatic.  Eyes: EOM are normal. Pupils are equal, round, and reactive to light.  Neck: Normal range of motion. Neck supple.  Cardiovascular: Normal rate and regular rhythm.  Exam reveals no gallop and no friction rub.   No murmur heard. Pulmonary/Chest: Effort normal and breath sounds normal. No respiratory distress. She has no wheezes.  Abdominal: Soft. Bowel sounds are normal. She exhibits no distension. There is no tenderness.  Musculoskeletal: Normal range of motion.  Neurological: She is alert and oriented to person, place, and time. No cranial nerve deficit. She exhibits  normal muscle tone. Coordination normal.  Skin: Skin is warm and dry. She is not diaphoretic.    ED Course  Procedures (including critical care time)  Labs Reviewed  CBC WITH DIFFERENTIAL  COMPREHENSIVE METABOLIC PANEL   No results found.   No diagnosis found.   Date: 04/29/2012  Rate: 66  Rhythm: normal sinus rhythm  QRS Axis: normal  Intervals: normal  ST/T Wave abnormalities: normal  Conduction Disutrbances:none  Narrative Interpretation:   Old EKG Reviewed: unchanged    MDM  The patient presents with dizziness that may well be related to her seroquel.  The symptoms seem to coincide with this being started and stopped.  No other causative pathology was identified today.  She is stable for discharge to discuss her medications with her pcp.          Geoffery Lyons, MD 04/29/12 1438

## 2012-04-29 NOTE — ED Notes (Signed)
Per EMS: pt from home c/o dizziness and generalized weakness x 1 year after starting to take seroquel; pt seen here on multiple occasions for same with negative workup

## 2012-05-01 ENCOUNTER — Ambulatory Visit
Admission: RE | Admit: 2012-05-01 | Discharge: 2012-05-01 | Disposition: A | Discharge status: Home / Self Care | Payer: Medicare Other | Source: Ambulatory Visit | Attending: Internal Medicine | Admitting: Internal Medicine

## 2012-05-01 DIAGNOSIS — Z1231 Encounter for screening mammogram for malignant neoplasm of breast: Secondary | ICD-10-CM

## 2012-05-04 ENCOUNTER — Encounter (HOSPITAL_COMMUNITY): Payer: Self-pay | Admitting: Emergency Medicine

## 2012-05-04 ENCOUNTER — Emergency Department (HOSPITAL_COMMUNITY)
Admission: EM | Admit: 2012-05-04 | Discharge: 2012-05-04 | Disposition: A | Discharge status: Home / Self Care | Payer: Medicare Other | Attending: Emergency Medicine | Admitting: Emergency Medicine

## 2012-05-04 DIAGNOSIS — Z8709 Personal history of other diseases of the respiratory system: Secondary | ICD-10-CM | POA: Insufficient documentation

## 2012-05-04 DIAGNOSIS — Z79899 Other long term (current) drug therapy: Secondary | ICD-10-CM | POA: Insufficient documentation

## 2012-05-04 DIAGNOSIS — G8929 Other chronic pain: Secondary | ICD-10-CM | POA: Insufficient documentation

## 2012-05-04 DIAGNOSIS — F410 Panic disorder [episodic paroxysmal anxiety] without agoraphobia: Secondary | ICD-10-CM | POA: Insufficient documentation

## 2012-05-04 DIAGNOSIS — R42 Dizziness and giddiness: Secondary | ICD-10-CM | POA: Insufficient documentation

## 2012-05-04 DIAGNOSIS — M549 Dorsalgia, unspecified: Secondary | ICD-10-CM | POA: Insufficient documentation

## 2012-05-04 DIAGNOSIS — F209 Schizophrenia, unspecified: Secondary | ICD-10-CM | POA: Insufficient documentation

## 2012-05-04 DIAGNOSIS — F329 Major depressive disorder, single episode, unspecified: Secondary | ICD-10-CM | POA: Insufficient documentation

## 2012-05-04 DIAGNOSIS — Z8739 Personal history of other diseases of the musculoskeletal system and connective tissue: Secondary | ICD-10-CM | POA: Insufficient documentation

## 2012-05-04 DIAGNOSIS — Z87891 Personal history of nicotine dependence: Secondary | ICD-10-CM | POA: Insufficient documentation

## 2012-05-04 DIAGNOSIS — M5380 Other specified dorsopathies, site unspecified: Secondary | ICD-10-CM | POA: Insufficient documentation

## 2012-05-04 DIAGNOSIS — F411 Generalized anxiety disorder: Secondary | ICD-10-CM | POA: Insufficient documentation

## 2012-05-04 DIAGNOSIS — F3289 Other specified depressive episodes: Secondary | ICD-10-CM | POA: Insufficient documentation

## 2012-05-04 MED ORDER — MECLIZINE HCL 50 MG PO TABS: 50.0000 mg | ORAL_TABLET | Freq: Two times a day (BID) | ORAL | Status: DC

## 2012-05-04 NOTE — ED Notes (Signed)
Pt. Is eating  A box lunch

## 2012-05-04 NOTE — ED Notes (Signed)
MD at bedside. 
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
## 2012-05-04 NOTE — ED Notes (Signed)
Dizziness and back spasms since last Spring.

## 2012-05-07 NOTE — ED Provider Notes (Signed)
Medical screening examination/treatment/procedure(s) were performed by non-physician practitioner and as supervising physician I was immediately available for consultation/collaboration.  Travante Knee, MD 05/07/12 0023 

## 2012-05-17 ENCOUNTER — Encounter: Payer: Medicare Other | Admitting: Obstetrics & Gynecology

## 2012-05-28 ENCOUNTER — Ambulatory Visit (INDEPENDENT_AMBULATORY_CARE_PROVIDER_SITE_OTHER): Payer: Medicare Other | Admitting: Family Medicine

## 2012-05-28 ENCOUNTER — Encounter: Payer: Self-pay | Admitting: Family Medicine

## 2012-05-28 ENCOUNTER — Other Ambulatory Visit: Payer: Self-pay | Admitting: *Deleted

## 2012-05-28 VITALS — BP 139/80 | HR 74 | Ht 62.5 in | Wt 254.0 lb

## 2012-05-28 DIAGNOSIS — M170 Bilateral primary osteoarthritis of knee: Secondary | ICD-10-CM

## 2012-05-28 DIAGNOSIS — Z20828 Contact with and (suspected) exposure to other viral communicable diseases: Secondary | ICD-10-CM

## 2012-05-28 DIAGNOSIS — Z6841 Body Mass Index (BMI) 40.0 and over, adult: Secondary | ICD-10-CM

## 2012-05-28 DIAGNOSIS — R42 Dizziness and giddiness: Secondary | ICD-10-CM

## 2012-05-28 DIAGNOSIS — Z113 Encounter for screening for infections with a predominantly sexual mode of transmission: Secondary | ICD-10-CM

## 2012-05-28 MED ORDER — MELOXICAM 7.5 MG PO TABS: 7.5000 mg | ORAL_TABLET | Freq: Every day | ORAL | Status: DC | PRN

## 2012-05-28 NOTE — Assessment & Plan Note (Signed)
Unclear etiology of dizziness. DDX - BPPV (unlikely given negative Dix hallpike), Presyncope (patient does not endorse any symptoms of this nature), Vertigo, Psychiatric related. Will continue to follow.  Will continue meclizine for now and monitor for improvement.

## 2012-05-28 NOTE — Assessment & Plan Note (Signed)
Discussed diet and exercise today.  Patient states she has plans to start exercising in the near future. Will continue to follow.  Possible referral to Dr. Gerilyn Pilgrim if patient desires.

## 2012-05-28 NOTE — Assessment & Plan Note (Signed)
HIV and RPR today. 

## 2012-05-28 NOTE — Patient Instructions (Addendum)
Diet Recommendations:  Increase amount of fruits and veggies. Eat lean meats (chicken, fish, lean beef). Increase intake of whole grains.  Try to cut back on fast foods, and fatty foods.   Exercise recommendations: Try to exercise at least 30 mins a day, 3 times a week.   I have refilled your mobic.  I'm unsure of the cause of your dizziness. Please continue to take your Meclizine.  I will call you with the results of your blood tests.

## 2012-05-28 NOTE — Assessment & Plan Note (Signed)
Mobic refilled.

## 2012-05-28 NOTE — Progress Notes (Signed)
Subjective:     Patient ID: Rachel Alvarado, female   DOB: 12-14-1965, 47 y.o.   MRN: [id number]  HPI Ms. Hoak presents to the clinic today for follow up.    1) Screening for STD's - Ms Klahn was recently seen in the clinic for this and did not return for HIV and RPR - Requesting blood work today  2) Medication refill - Patient has history of osteoarthritis - Requesting medication refill of Mobic  3) Obesity - Wants to discuss weight loss today - Admits to poor diet (fatty foods predominantly) and infrequent exercise  4) Dizziness - States this has been bothering her for approximately 1 year - Describes is as a "rush of blood" going to her head.  It is intermittent occuring almost daily. - Denies feelings of syncope. - Reports it is associated with movement. - Improves but does not resolve with meclizine. - No associated hearing loss or headache.    Review of Systems See HPI    Objective:   Physical Exam Filed Vitals:   05/28/12 1623  BP: 139/80  Pulse: 74   Gen: well appearing. NAD. HEENT: Dix Hallpike negative. Heart: RRR. No murmurs, rubs, or gallops. Lungs: CTAB. No rales, rhonchi, or wheeze. Abdomen: soft, nontender, nondistended. Extremities: no edema.     Assessment:        Plan:

## 2012-05-29 ENCOUNTER — Encounter: Payer: Self-pay | Admitting: Family Medicine

## 2012-05-29 LAB — HIV ANTIBODY (ROUTINE TESTING W REFLEX): HIV: NONREACTIVE

## 2012-06-06 ENCOUNTER — Encounter: Payer: Medicare Other | Admitting: Obstetrics & Gynecology

## 2012-06-07 ENCOUNTER — Ambulatory Visit: Payer: Medicare Other | Admitting: Family Medicine

## 2012-06-28 ENCOUNTER — Encounter: Payer: Medicare Other | Admitting: Obstetrics & Gynecology

## 2012-07-03 ENCOUNTER — Ambulatory Visit: Payer: Medicare Other | Admitting: Family Medicine

## 2012-07-23 ENCOUNTER — Ambulatory Visit (INDEPENDENT_AMBULATORY_CARE_PROVIDER_SITE_OTHER): Payer: Medicare Other | Admitting: Obstetrics & Gynecology

## 2012-07-23 ENCOUNTER — Ambulatory Visit (INDEPENDENT_AMBULATORY_CARE_PROVIDER_SITE_OTHER): Payer: Medicare Other | Admitting: Family Medicine

## 2012-07-23 ENCOUNTER — Encounter: Payer: Self-pay | Admitting: Obstetrics & Gynecology

## 2012-07-23 VITALS — BP 154/88 | HR 73 | Temp 97.6°F | Ht 64.0 in | Wt 248.2 lb

## 2012-07-23 VITALS — BP 155/93 | HR 81 | Ht 65.0 in | Wt 248.0 lb

## 2012-07-23 DIAGNOSIS — Z87898 Personal history of other specified conditions: Secondary | ICD-10-CM

## 2012-07-23 DIAGNOSIS — Z8669 Personal history of other diseases of the nervous system and sense organs: Secondary | ICD-10-CM

## 2012-07-23 DIAGNOSIS — N949 Unspecified condition associated with female genital organs and menstrual cycle: Secondary | ICD-10-CM

## 2012-07-23 DIAGNOSIS — Z0000 Encounter for general adult medical examination without abnormal findings: Secondary | ICD-10-CM

## 2012-07-23 DIAGNOSIS — R102 Pelvic and perineal pain: Secondary | ICD-10-CM

## 2012-07-23 MED ORDER — CALCIUM CARBONATE-VIT D-MIN 600-400 MG-UNIT PO TABS: 2.0000 | ORAL_TABLET | Freq: Every day | ORAL | Status: DC

## 2012-07-23 NOTE — Assessment & Plan Note (Signed)
Patient started on Calcium and Vitamin D supplementation today to ensure adequate calcium and vitamin D intake/stores.

## 2012-07-23 NOTE — Patient Instructions (Addendum)
It was nice seeing you today.  We will get the records regarding your sleep apnea and then get you a CPAP machine.  Please take your supplements for bone health as prescribed.  Follow up with me in 3-6 months or earlier as needed.

## 2012-07-23 NOTE — Progress Notes (Signed)
Subjective:     Patient ID: Rachel Alvarado, female   DOB: 22-Jun-1965, 47 y.o.   MRN: [id number]  HPI Pt presents with request to have left ovary removed.  'it has given me problems since i was 47 years old and I want it taken out'.  Pt denies any pain or problems currently.  Her last sonogram was Dec 2013.  She denies problems since that time.  She denies abnormal bleeding or discharge.   Past Medical History  Diagnosis Date  . Arthritis   . Chronic back pain   . Depression   . Anxiety   . Panic attacks   . Schizophrenia   . Sleep apnea    Past Surgical History  Procedure Laterality Date  . Cesarean section    . Partial hysterectomy    . Cholecystectomy     Current Outpatient Prescriptions on File Prior to Visit  Medication Sig Dispense Refill  . diclofenac sodium (VOLTAREN) 1 % GEL Apply 4 g topically 4 (four) times daily. For knee pain      . meloxicam (MOBIC) 7.5 MG tablet Take 1 tablet (7.5 mg total) by mouth daily as needed. For pain and inflammation  30 tablet  3  . QUEtiapine (SEROQUEL) 100 MG tablet Take 300 mg by mouth at bedtime.       . meclizine (ANTIVERT) 25 MG tablet Take 25 mg by mouth 3 (three) times daily as needed for dizziness.      . meclizine (ANTIVERT) 50 MG tablet Take 1 tablet (50 mg total) by mouth 2 (two) times daily.  14 tablet  0  . metroNIDAZOLE (FLAGYL) 500 MG tablet Take 500 mg by mouth 2 (two) times daily. Started course today       No current facility-administered medications on file prior to visit.   Allergies  Allergen Reactions  . Sulfa Antibiotics Hives      Review of Systems     Objective:   Physical Exam BP 154/88  Pulse 73  Temp(Src) 97.6 F (36.4 C) (Oral)  Ht 5\' 4"  (1.626 m)  Wt 248 lb 3.2 oz (112.583 kg)  BMI 42.58 kg/m2 Pt declined exam as she is having no acute problems  03/09/2012 TRANSABDOMINAL AND TRANSVAGINAL ULTRASOUND OF PELVIS  Technique: Both transabdominal and transvaginal ultrasound  examinations of the  pelvis were performed. Transabdominal technique  was performed for global imaging of the pelvis including uterus,  ovaries, adnexal regions, and pelvic cul-de-sac.  It was necessary to proceed with endovaginal exam following the  transabdominal exam to visualize the adnexa.  Comparison: None  Findings:  Uterus: Removed. Calcification vaginal cuff.  Right ovary: 3.1 x 2.0 x 1.7 cm. No dominant mass.  Left ovary: 5.0 x 3.2 x 3.6 cm. Two cysts/follicles measuring up  to 2.2 cm.  Other findings: Trace  IMPRESSION:  Post hysterectomy. Calcification of the vaginal cuff incidentally  noted.  No worrisome adnexal abnormality. Left ovary contains two small  cysts or follicles.      Assessment:     Physiologic cyst/follicle previously on ovary in pt nearing menopause    Plan:     F/u prn Educated on normal physiologic cysts Educated on changes assoc with menopause

## 2012-07-23 NOTE — Patient Instructions (Signed)
Perimenopause Perimenopause is the time when your body begins to move into the menopause (no menstrual period for 12 straight months). It is a natural process. Perimenopause can begin 2 to 8 years before the menopause and usually lasts for one year after the menopause. During this time, your ovaries may or may not produce an egg. The ovaries vary in their production of estrogen and progesterone hormones each month. This can cause irregular menstrual periods, difficulty in getting pregnant, vaginal bleeding between periods and uncomfortable symptoms. CAUSES  Irregular production of the ovarian hormones, estrogen and progesterone, and not ovulating every month.  Other causes include:  Tumor of the pituitary gland in the brain.  Medical disease that affects the ovaries.  Radiation treatment.  Chemotherapy.  Unknown causes.  Heavy smoking and excessive alcohol intake can bring on perimenopause sooner. SYMPTOMS   Hot flashes.  Night sweats.  Irregular menstrual periods.  Decrease sex drive.  Vaginal dryness.  Headaches.  Mood swings.  Depression.  Memory problems.  Irritability.  Tiredness.  Weight gain.  Trouble getting pregnant.  The beginning of losing bone cells (osteoporosis).  The beginning of hardening of the arteries (atherosclerosis). DIAGNOSIS  Your caregiver will make a diagnosis by analyzing your age, menstrual history and your symptoms. They will do a physical exam noting any changes in your body, especially your female organs. Female hormone tests may or may not be helpful depending on the amount and when you produce the female hormones. However, other hormone tests may be helpful (ex. thyroid hormone) to rule out other problems. TREATMENT  The decision to treat during the perimenopause should be made by you and your caregiver depending on how the symptoms are affecting you and your life style. There are various treatments available such as:  Treating  individual symptoms with a specific medication for that symptom (ex. tranquilizer for depression).  Herbal medications that can help specific symptoms.  Counseling.  Group therapy.  No treatment. HOME CARE INSTRUCTIONS   Before seeing your caregiver, make a list of your menstrual periods (when the occur, how heavy they are, how long between periods and how long they last), your symptoms and when they started.  Take the medication as recommended by your caregiver.  Sleep and rest.  Exercise.  Eat a diet that contains calcium (good for your bones) and soy (acts like estrogen hormone).  Do not smoke.  Avoid alcoholic beverages.  Taking vitamin E may help in certain cases.  Take calcium and vitamin D supplements to help prevent bone loss.  Group therapy is sometimes helpful.  Acupuncture may help in some cases. SEEK MEDICAL CARE IF:   You have any of the above and want to know if it is perimenopause.  You want advice and treatment for any of your symptoms mentioned above.  You need a referral to a specialist (gynecologist, psychiatrist or psychologist). SEEK IMMEDIATE MEDICAL CARE IF:   You have vaginal bleeding.  Your period lasts longer than 8 days.  You periods are recurring sooner than 21 days.  You have bleeding after intercourse.  You have severe depression.  You have pain when you urinate.  You have severe headaches.  You develop vision problems. Document Released: 04/14/2004 Document Revised: 05/30/2011 Document Reviewed: 01/03/2008 [facility] Patient Information 2013 [place], [place]. Menopause Menopause is the normal time of life when menstrual periods stop completely. Menopause is complete when you have missed 12 consecutive menstrual periods. It usually occurs between the ages of 22 to 26, with an  average age of 9. Very rarely does a woman develop menopause before 48 years old. At menopause, your ovaries stop producing the female hormones, estrogen and  progesterone. This can cause undesirable symptoms and also affect your health. Sometimes the symptoms may occur 4 to 5 years before the menopause begins. There is no relationship between menopause and:  Oral contraceptives.  Number of children you had.  Race.  The age your menstrual periods started (menarche). Heavy smokers and very thin women may develop menopause earlier in life. CAUSES  The ovaries stop producing the female hormones estrogen and progesterone.  Other causes include:  Surgery to remove both ovaries.  The ovaries stop functioning for no known reason.  Tumors of the pituitary gland in the brain.  Medical disease that affects the ovaries and hormone production.  Radiation treatment to the abdomen or pelvis.  Chemotherapy that affects the ovaries. SYMPTOMS   Hot flashes.  Night sweats.  Decrease in sex drive.  Vaginal dryness and thinning of the vagina causing painful intercourse.  Dryness of the skin and developing wrinkles.  Headaches.  Tiredness.  Irritability.  Memory problems.  Weight gain.  Bladder infections.  Hair growth of the face and chest.  Infertility. More serious symptoms include:  Loss of bone (osteoporosis) causing breaks (fractures).  Depression.  Hardening and narrowing of the arteries (atherosclerosis) causing heart attacks and strokes. DIAGNOSIS   When the menstrual periods have stopped for 12 straight months.  Physical exam.  Hormone studies of the blood. TREATMENT  There are many treatment choices and nearly as many questions about them. The decisions to treat or not to treat menopausal changes is an individual choice made with your caregiver. Your caregiver can discuss the treatments with you. Together, you can decide which treatment will work best for you. Your treatment choices may include:   Hormone therapy (estorgen and progesterone).  Non-hormonal medications.  Treating the individual symptoms with  medication (for example antidepressants for depression).  Herbal medications that may help specific symptoms.  Counseling by a psychiatrist or psychologist.  Group therapy.  Lifestyle changes including:  Eating healthy.  Regular exercise.  Limiting caffeine and alcohol.  Stress management and meditation.  No treatment. HOME CARE INSTRUCTIONS   Take the medication your caregiver gives you as directed.  Get plenty of sleep and rest.  Exercise regularly.  Eat a diet that contains calcium (good for the bones) and soy products (acts like estrogen hormone).  Avoid alcoholic beverages.  Do not smoke.  If you have hot flashes, dress in layers.  Take supplements, calcium and vitamin D to strengthen bones.  You can use over-the-counter lubricants or moisturizers for vaginal dryness.  Group therapy is sometimes very helpful.  Acupuncture may be helpful in some cases. SEEK MEDICAL CARE IF:   You are not sure you are in menopause.  You are having menopausal symptoms and need advice and treatment.  You are still having menstrual periods after age 23.  You have pain with intercourse.  Menopause is complete (no menstrual period for 12 months) and you develop vaginal bleeding.  You need a referral to a specialist (gynecologist, psychiatrist or psychologist) for treatment. SEEK IMMEDIATE MEDICAL CARE IF:   You have severe depression.  You have excessive vaginal bleeding.  You fell and think you have a broken bone.  You have pain when you urinate.  You develop leg or chest pain.  You have a fast pounding heart beat (palpitations).  You have severe headaches.  You develop vision problems.  You feel a lump in your breast.  You have abdominal pain or severe indigestion. Document Released: 05/28/2003 Document Revised: 05/30/2011 Document Reviewed: 01/03/2008 [facility] Patient Information 2013 [place], [place].

## 2012-07-23 NOTE — Progress Notes (Signed)
Subjective:     Patient ID: Rachel Alvarado, female   DOB: April 07, 1965, 47 y.o.   MRN: [id number]  HPI [name] presents to the clinic today for follow up.  1) Hx of Sleep apnea - She reports prior history/diagnosis of sleep apnea.  Diagnosis was made in [place] (at [facility]).  - She states that she was on CPAP, but has not had a one for years. - She is concerned that her "brain is not receiving enough oxygen." She is also concerned about frequent yawning during the day. - She denies excessive daytime sleepiness and states that she is awake/alert and high functioning during the day.  She does, however, report loud snoring which she has had for years.    2) Concern for Osteoporosis/poor bone health - Patient is quite perseverative about this. - She states that she has brittle bones and needs to be on calcium supplementation. - She states that her neck pops/cracks frequently and she has had several fractures in the past. - No recent fall or trauma.  Patient has had a DEXA scan in the past (2012).   Review of Systems Per HPI    Objective:   Physical Exam Filed Vitals:   07/23/12 1558  BP: 155/93  Pulse: 81  General: well appearing obese female in NAD. Psych: Anxious. Normal affect. Patient continues to worry/perseverate about various things, insisting further work up and treatment.      Assessment:     See prob list    Plan:

## 2012-07-23 NOTE — Assessment & Plan Note (Signed)
Will obtain records from hospital in [place] where sleep study was done. If evidence of sleep apnea, will send in Rx for CPAP.   If unable to obtain records will send patient for sleep study.

## 2012-08-14 ENCOUNTER — Telehealth: Payer: Self-pay | Admitting: Family Medicine

## 2012-08-14 DIAGNOSIS — Z8669 Personal history of other diseases of the nervous system and sense organs: Secondary | ICD-10-CM

## 2012-08-14 NOTE — Telephone Encounter (Signed)
Will fwd to MD for information.  Tanika Bracco, Darlyne Russian, CMA

## 2012-08-14 NOTE — Telephone Encounter (Signed)
Pt is wondering if medical records have been received from [place] hospital. Wants to know what is taking so long. Please advise

## 2012-08-16 NOTE — Telephone Encounter (Signed)
Medical records received. No record of Sleep study from [facility].   Will order patient's sleep study.  [facility] will be in contact.

## 2012-08-20 NOTE — Telephone Encounter (Signed)
When attempting to contact the patient for the Sleep Apnea appointment, please call [phone] or [phone].

## 2012-09-19 ENCOUNTER — Ambulatory Visit (HOSPITAL_BASED_OUTPATIENT_CLINIC_OR_DEPARTMENT_OTHER): Payer: Medicare Other | Attending: Family Medicine | Admitting: Radiology

## 2012-09-19 VITALS — Ht 65.0 in | Wt 243.0 lb

## 2012-09-19 DIAGNOSIS — G4761 Periodic limb movement disorder: Secondary | ICD-10-CM | POA: Insufficient documentation

## 2012-09-19 DIAGNOSIS — R351 Nocturia: Secondary | ICD-10-CM | POA: Insufficient documentation

## 2012-09-19 DIAGNOSIS — G4733 Obstructive sleep apnea (adult) (pediatric): Secondary | ICD-10-CM

## 2012-09-19 DIAGNOSIS — Z8669 Personal history of other diseases of the nervous system and sense organs: Secondary | ICD-10-CM

## 2012-09-22 DIAGNOSIS — G4761 Periodic limb movement disorder: Secondary | ICD-10-CM

## 2012-09-22 DIAGNOSIS — R351 Nocturia: Secondary | ICD-10-CM

## 2012-09-22 DIAGNOSIS — G4733 Obstructive sleep apnea (adult) (pediatric): Secondary | ICD-10-CM

## 2012-09-23 NOTE — Procedures (Signed)
Rachel Alvarado, Rachel Alvarado                ACCOUNT NO.:  [id number]  MEDICAL RECORD NO.:  [id number]          PATIENT TYPE:  OUT  LOCATION:  SLEEP CENTER                 FACILITY:  [facility]  PHYSICIAN:  Kyan Giannone D. Maple Hudson, MD, FCCP, FACPDATE OF BIRTH:  01-07-1965  DATE OF STUDY:  09/19/2012                           NOCTURNAL POLYSOMNOGRAM  REFERRING PHYSICIAN:  Nestor Ramp, MD  INDICATION FOR STUDY:  Insomnia with sleep apnea.  EPWORTH SLEEPINESS SCORE:  7/24.  BMI 40.3.  Weight 242 pounds.  Height 65 inches.  Neck 16.5 inches.  MEDICATIONS:  Home medications charted for review.  SLEEP ARCHITECTURE:  Split study protocol.  During the diagnostic phase, total sleep time 120.5 minutes with sleep efficiency 62.4%.  Stage I was 10.4%, stage II 89.6%, stages III and REM were absent.  Sleep latency 13.5 minutes, awake after sleep onset 59 minutes.  Arousal index 87.6. Bedtime medications:  Seroquel.  RESPIRATORY DATA:  Split study protocol.  Apnea/hypopnea index (AHI) 32.9 per hour.  A total of 66 events was scored including 33 obstructive apneas, 4 central apneas, 4 mixed apneas, 25 hypopneas.  Events were seen in all sleep positions, particularly while supine.  CPAP was titrated to 19 CWP, AHI 0 per hour.  She wore a medium ResMed Quattro FX full-face mask with heated humidifier.  OXYGEN DATA:  Moderate snoring before CPAP with oxygen desaturation to a nadir of 87% on room air.  With CPAP titration, snoring was prevented and mean oxygen saturation held 97.1% on room air.  CARDIAC DATA:  Normal sinus rhythm.  MOVEMENT/PARASOMNIA:  During the diagnostic phase, a total of 61 limb jerks were counted, of which 35 were associated with arousals or awakening for periodic limb movement with arousal index of 17.4 per hour.  During the titration phase, only 14 events were counted of which 4 were associated with arousal or awakening for periodic limb movement with arousal index of 1.5 per hour.   Bathroom x4.  IMPRESSION/RECOMMENDATION: 1. Severe obstructive sleep apnea/hypopnea syndrome, AHI 32.9 per hour     with mainly supine events.  Moderate snoring with oxygen     desaturation to a nadir of 87% on room air. 2. Successful CPAP titration to 19 CWP, AHI 0 per hour.  She wore a     medium ResMed Quattro FX full-face mask with heated humidifier.     Snoring was prevented and mean oxygen saturation held 97.1% on room     air with CPAP. 3. Periodic limb movement with arousal, significantly more frequent     before CPAP.  This suggests that most of     the events were actually respiratory related arousals. 4. Sleep was additionally interrupted by nocturia x4.     [name], MD, [facility], FACP Diplomate, American Board of Sleep Medicine    CDY/MEDQ  D:  09/22/2012 10:29:17  T:  09/23/2012 01:10:19  Job:  [id number]

## 2012-09-26 ENCOUNTER — Other Ambulatory Visit: Payer: Self-pay | Admitting: Family Medicine

## 2012-09-26 DIAGNOSIS — G4733 Obstructive sleep apnea (adult) (pediatric): Secondary | ICD-10-CM

## 2013-02-19 ENCOUNTER — Telehealth: Payer: Self-pay | Admitting: Radiology

## 2013-02-19 NOTE — Telephone Encounter (Signed)
[facility] called today, they need NPI number for Dr Milus Glazier, on the order, I am unsure how to do this, and get it to [facility] help

## 2013-04-01 ENCOUNTER — Other Ambulatory Visit: Payer: Self-pay

## 2013-04-01 DIAGNOSIS — Z1231 Encounter for screening mammogram for malignant neoplasm of breast: Secondary | ICD-10-CM

## 2013-05-06 ENCOUNTER — Ambulatory Visit
Admission: RE | Admit: 2013-05-06 | Discharge: 2013-05-06 | Disposition: A | Discharge status: Home / Self Care | Payer: Medicaid Other | Source: Ambulatory Visit

## 2013-05-06 DIAGNOSIS — Z1231 Encounter for screening mammogram for malignant neoplasm of breast: Secondary | ICD-10-CM

## 2013-05-17 ENCOUNTER — Encounter (HOSPITAL_COMMUNITY): Payer: Self-pay | Admitting: Emergency Medicine

## 2013-05-17 ENCOUNTER — Emergency Department (HOSPITAL_COMMUNITY)
Admission: EM | Admit: 2013-05-17 | Discharge: 2013-05-17 | Discharge status: Against Medical Advice | Payer: Medicare Other | Attending: Emergency Medicine | Admitting: Emergency Medicine

## 2013-05-17 DIAGNOSIS — R1110 Vomiting, unspecified: Secondary | ICD-10-CM | POA: Insufficient documentation

## 2013-05-17 DIAGNOSIS — R197 Diarrhea, unspecified: Secondary | ICD-10-CM | POA: Insufficient documentation

## 2013-05-17 DIAGNOSIS — R109 Unspecified abdominal pain: Secondary | ICD-10-CM | POA: Insufficient documentation

## 2013-05-17 DIAGNOSIS — Z87891 Personal history of nicotine dependence: Secondary | ICD-10-CM | POA: Insufficient documentation

## 2013-05-17 DIAGNOSIS — G8929 Other chronic pain: Secondary | ICD-10-CM | POA: Insufficient documentation

## 2013-05-17 MED ORDER — ONDANSETRON 8 MG PO TBDP
8.0000 mg | ORAL_TABLET | Freq: Once | ORAL | Status: AC
Start: 2013-05-17 — End: 2013-05-17
  Administered 2013-05-17: 8 mg via ORAL
  Filled 2013-05-17: qty 1

## 2013-05-17 NOTE — ED Notes (Signed)
Per EMS-states patient ate potatoe salad last night and 2 hours later became sick-went to urgent care and got there too late so she called EMS to take her to ED

## 2013-05-17 NOTE — ED Notes (Signed)
Called 2x to draw labs - no answer.

## 2013-05-17 NOTE — ED Notes (Signed)
Pt called x 3 for room no answer 

## 2013-07-28 ENCOUNTER — Emergency Department (HOSPITAL_COMMUNITY)
Admission: EM | Admit: 2013-07-28 | Discharge: 2013-07-28 | Disposition: A | Discharge status: Home / Self Care | Payer: Medicare Other | Attending: Emergency Medicine | Admitting: Emergency Medicine

## 2013-07-28 ENCOUNTER — Encounter (HOSPITAL_COMMUNITY): Payer: Self-pay | Admitting: Emergency Medicine

## 2013-07-28 ENCOUNTER — Emergency Department (HOSPITAL_COMMUNITY): Payer: Medicare Other

## 2013-07-28 DIAGNOSIS — F3289 Other specified depressive episodes: Secondary | ICD-10-CM | POA: Insufficient documentation

## 2013-07-28 DIAGNOSIS — J441 Chronic obstructive pulmonary disease with (acute) exacerbation: Secondary | ICD-10-CM | POA: Insufficient documentation

## 2013-07-28 DIAGNOSIS — F209 Schizophrenia, unspecified: Secondary | ICD-10-CM

## 2013-07-28 DIAGNOSIS — Z79899 Other long term (current) drug therapy: Secondary | ICD-10-CM | POA: Insufficient documentation

## 2013-07-28 DIAGNOSIS — F411 Generalized anxiety disorder: Secondary | ICD-10-CM | POA: Insufficient documentation

## 2013-07-28 DIAGNOSIS — G4733 Obstructive sleep apnea (adult) (pediatric): Secondary | ICD-10-CM | POA: Insufficient documentation

## 2013-07-28 DIAGNOSIS — G8929 Other chronic pain: Secondary | ICD-10-CM | POA: Insufficient documentation

## 2013-07-28 DIAGNOSIS — Z87891 Personal history of nicotine dependence: Secondary | ICD-10-CM | POA: Insufficient documentation

## 2013-07-28 DIAGNOSIS — Z8739 Personal history of other diseases of the musculoskeletal system and connective tissue: Secondary | ICD-10-CM | POA: Insufficient documentation

## 2013-07-28 DIAGNOSIS — F329 Major depressive disorder, single episode, unspecified: Secondary | ICD-10-CM | POA: Insufficient documentation

## 2013-07-28 DIAGNOSIS — R0600 Dyspnea, unspecified: Secondary | ICD-10-CM

## 2013-07-28 HISTORY — DX: Chronic obstructive pulmonary disease, unspecified: J44.9

## 2013-07-28 NOTE — ED Provider Notes (Signed)
CSN: [id number]     Arrival date & time 07/28/13  0334 History   First MD Initiated Contact with Patient 07/28/13 0502     Chief Complaint  Patient presents with  . Shortness of Breath     (Consider location/radiation/quality/duration/timing/severity/associated sxs/prior Treatment) Patient is a 48 y.o. female presenting with shortness of breath. The history is provided by the patient.  Shortness of Breath She brings in paperwork stating that she has a diagnosis of obstructive sleep apnea and states that she wants help getting the machine to use for her. She states that she got the machine but didn't think she needed it so she saw that and now Medicare will not get the machine for her. She states that her breathing has been worse over the last 2 days. There is no cough no chest pain. There is no fever or chills. There's been no vomiting or diarrhea. Her thought process is quite disjointed and it is difficult to get her to focus on what I am asking her. She states that she does have [facility] and she is also under the care of a psychiatrist.  Past Medical History  Diagnosis Date  . Arthritis   . Chronic back pain   . Depression   . Anxiety   . Panic attacks   . Schizophrenia   . Sleep apnea   . COPD (chronic obstructive pulmonary disease)    Past Surgical History  Procedure Laterality Date  . Cesarean section    . Partial hysterectomy    . Cholecystectomy     Family History  Problem Relation Age of Onset  . Alcohol abuse Father   . Heart disease Father   . Diabetes Mellitus II Brother   . Diabetes Mellitus II Father   . Stroke Sister   . Osteoporosis Mother    History  Substance Use Topics  . Smoking status: Former Smoker -- 0.02 packs/day    Types: Cigarettes    Quit date: 02/03/2012  . Smokeless tobacco: Never Used  . Alcohol Use: No   OB History   Grav Para Term Preterm Abortions TAB SAB Ect Mult Living                 Review of Systems   Respiratory: Positive for shortness of breath.   All other systems reviewed and are negative.     Allergies  Sulfa antibiotics  Home Medications   Prior to Admission medications   Medication Sig Start Date End Date Taking? Authorizing Provider  cyclobenzaprine (FLEXERIL) 10 MG tablet Take 10 mg by mouth 2 (two) times daily.   Yes Historical Provider, MD  meloxicam (MOBIC) 7.5 MG tablet Take 1 tablet (7.5 mg total) by mouth daily as needed. For pain and inflammation 05/28/12  Yes Jayce G Cook, DO  QUEtiapine (SEROQUEL) 300 MG tablet Take 150 mg by mouth at bedtime.   Yes Historical Provider, MD  sertraline (ZOLOFT) 50 MG tablet Take 50 mg by mouth daily.   Yes Historical Provider, MD   BP 114/51  Pulse 87  Temp(Src) 98.6 F (37 C) (Oral)  Resp 18  SpO2 97% Physical Exam  Nursing note and vitals reviewed.  48 year old female, resting comfortably and in no acute distress. Vital signs are normal. Oxygen saturation is 97%, which is normal. Head is normocephalic and atraumatic. PERRLA, EOMI. Oropharynx is clear. Neck is nontender and supple without adenopathy or JVD. Back is nontender and there is no CVA  tenderness. Lungs are clear without rales, wheezes, or rhonchi. Chest is nontender. Heart has regular rate and rhythm without murmur. Abdomen is soft, flat, nontender without masses or hepatosplenomegaly and peristalsis is normoactive. Extremities have no cyanosis or edema, full range of motion is present. Skin is warm and dry without rash. Neurologic: She is awake, alert, oriented x3, cranial nerves are intact, there are no motor or sensory deficits. Psychiatric: She has disjointed thought process with flight of ideas. She denies homicidal or suicidal ideation.  ED Course  Procedures (including critical care time) Imaging Review Dg Chest 2 View  07/28/2013   CLINICAL DATA:  Shortness of breath.  Anxiety attack.  03/24/2010  EXAM: CHEST  2 VIEW  COMPARISON:  03/24/2010   FINDINGS: Chronic mild cardiomegaly. No edema, consolidation, effusion, or pneumothorax. Cholecystectomy changes. Spondylotic spurring in the lower thoracic spine.  IMPRESSION: No active cardiopulmonary disease.   Electronically Signed   By: Tiburcio PeaJonathan  Watts M.D.   On: 07/28/2013 05:54     EKG Interpretation   Date/Time:  Sunday Jul 28 2013 05:21:05 EDT Ventricular Rate:  76 PR Interval:  170 QRS Duration: 81 QT Interval:  397 QTC Calculation: 446 R Axis:   62 Text Interpretation:  Sinus rhythm Normal ECG When compared with ECG of  04/20/2012, No significant change was found Confirmed by [facility]  MD, Larue Lightner  ([id number]) on 07/28/2013 5:24:00 AM      MDM   Final diagnoses:  Dyspnea  Obstructive sleep apnea  Schizophrenia    Subjective dyspnea in patient with history of obstructive sleep apnea. Old records are reviewed and the sleep study last January does diagnose her with severe obstructive sleep apnea. However, I cannot help her with obtaining the CPAP machine. This have to be done through her PCP. In order to try and address some of her concerns, chest x-ray and ECG will be obtained. She is reassured that her oxygen levels are normal by pulse oximetry and she is requesting that I check blood to be sure that it is accurate but I have assured her that there is no need for blood tests today. She will need to see her psychiatrist to see if she is on appropriate medications for her schizophrenia.  Chest chest x-ray and ECG are normal. Patient is reassured of benign findings and is referred back to PCP regarding whether she would qualify for CPAP machine at home.  Dione Boozeavid Ozias Dicenzo, MD 07/28/13 [phone]

## 2013-07-28 NOTE — ED Notes (Signed)
MD at bedside. 

## 2013-07-28 NOTE — ED Notes (Signed)
Pt states she want to speak with the MD about helping her get a breathing machine; pt stats she has hx of SOB and for past two days breathing has become more difficult.

## 2013-07-28 NOTE — Discharge Instructions (Signed)
Your ECG and chest x-ray were normal. The machine for sleep apnea needs to be arranged through your PCP. Please make an appointment with your PCP and to be seen as soon as possible. He should also make an appointment to see her psychiatrist to make sure that all of your medications are at the optimum dose.

## 2013-09-21 ENCOUNTER — Encounter (HOSPITAL_COMMUNITY): Payer: Self-pay | Admitting: Emergency Medicine

## 2013-09-21 ENCOUNTER — Emergency Department (HOSPITAL_COMMUNITY)
Admission: EM | Admit: 2013-09-21 | Discharge: 2013-09-21 | Disposition: A | Discharge status: Home / Self Care | Payer: Medicare Other | Attending: Emergency Medicine | Admitting: Emergency Medicine

## 2013-09-21 DIAGNOSIS — F410 Panic disorder [episodic paroxysmal anxiety] without agoraphobia: Secondary | ICD-10-CM | POA: Diagnosis not present

## 2013-09-21 DIAGNOSIS — G8929 Other chronic pain: Secondary | ICD-10-CM | POA: Diagnosis not present

## 2013-09-21 DIAGNOSIS — F329 Major depressive disorder, single episode, unspecified: Secondary | ICD-10-CM | POA: Diagnosis not present

## 2013-09-21 DIAGNOSIS — Z79899 Other long term (current) drug therapy: Secondary | ICD-10-CM | POA: Diagnosis not present

## 2013-09-21 DIAGNOSIS — Z008 Encounter for other general examination: Secondary | ICD-10-CM | POA: Diagnosis present

## 2013-09-21 DIAGNOSIS — Z9109 Other allergy status, other than to drugs and biological substances: Secondary | ICD-10-CM

## 2013-09-21 DIAGNOSIS — J309 Allergic rhinitis, unspecified: Secondary | ICD-10-CM | POA: Diagnosis not present

## 2013-09-21 DIAGNOSIS — R45850 Homicidal ideations: Secondary | ICD-10-CM | POA: Diagnosis not present

## 2013-09-21 DIAGNOSIS — F911 Conduct disorder, childhood-onset type: Secondary | ICD-10-CM | POA: Insufficient documentation

## 2013-09-21 DIAGNOSIS — F2089 Other schizophrenia: Secondary | ICD-10-CM | POA: Insufficient documentation

## 2013-09-21 DIAGNOSIS — I10 Essential (primary) hypertension: Secondary | ICD-10-CM | POA: Insufficient documentation

## 2013-09-21 DIAGNOSIS — F3289 Other specified depressive episodes: Secondary | ICD-10-CM | POA: Insufficient documentation

## 2013-09-21 DIAGNOSIS — Z113 Encounter for screening for infections with a predominantly sexual mode of transmission: Secondary | ICD-10-CM | POA: Insufficient documentation

## 2013-09-21 DIAGNOSIS — Z87891 Personal history of nicotine dependence: Secondary | ICD-10-CM | POA: Insufficient documentation

## 2013-09-21 DIAGNOSIS — M129 Arthropathy, unspecified: Secondary | ICD-10-CM | POA: Insufficient documentation

## 2013-09-21 DIAGNOSIS — J449 Chronic obstructive pulmonary disease, unspecified: Secondary | ICD-10-CM | POA: Diagnosis not present

## 2013-09-21 DIAGNOSIS — J4489 Other specified chronic obstructive pulmonary disease: Secondary | ICD-10-CM | POA: Insufficient documentation

## 2013-09-21 DIAGNOSIS — R443 Hallucinations, unspecified: Secondary | ICD-10-CM | POA: Diagnosis not present

## 2013-09-21 HISTORY — DX: Essential (primary) hypertension: I10

## 2013-09-21 LAB — CBC
HCT: 32.8 % — ABNORMAL LOW (ref 36.0–46.0)
Hemoglobin: 10.8 g/dL — ABNORMAL LOW (ref 12.0–15.0)
MCH: 30.3 pg (ref 26.0–34.0)
MCHC: 32.9 g/dL (ref 30.0–36.0)
MCV: 91.9 fL (ref 78.0–100.0)
PLATELETS: 296 10*3/uL (ref 150–400)
RBC: 3.57 MIL/uL — ABNORMAL LOW (ref 3.87–5.11)
RDW: 13.4 % (ref 11.5–15.5)
WBC: 8.4 10*3/uL (ref 4.0–10.5)

## 2013-09-21 LAB — COMPREHENSIVE METABOLIC PANEL
ALBUMIN: 3.4 g/dL — AB (ref 3.5–5.2)
ALT: 17 U/L (ref 0–35)
AST: 16 U/L (ref 0–37)
Alkaline Phosphatase: 59 U/L (ref 39–117)
Anion gap: 14 (ref 5–15)
BILIRUBIN TOTAL: 0.2 mg/dL — AB (ref 0.3–1.2)
BUN: 18 mg/dL (ref 6–23)
CHLORIDE: 103 meq/L (ref 96–112)
CO2: 24 mEq/L (ref 19–32)
CREATININE: 0.84 mg/dL (ref 0.50–1.10)
Calcium: 8.9 mg/dL (ref 8.4–10.5)
GFR calc Af Amer: >90 mL/min (ref >90)
GFR calc non Af Amer: 80 mL/min — ABNORMAL LOW (ref >90)
Glucose, Bld: 100 mg/dL — ABNORMAL HIGH (ref 70–99)
Potassium: 3.6 mEq/L — ABNORMAL LOW (ref 3.7–5.3)
Sodium: 141 mEq/L (ref 137–147)
TOTAL PROTEIN: 7.3 g/dL (ref 6.0–8.3)

## 2013-09-21 LAB — SALICYLATE LEVEL: Salicylate Lvl: <2 mg/dL — ABNORMAL LOW (ref 2.8–20.0)

## 2013-09-21 LAB — RPR

## 2013-09-21 LAB — ETHANOL: Alcohol, Ethyl (B): <11 mg/dL (ref 0–11)

## 2013-09-21 LAB — HIV ANTIBODY (ROUTINE TESTING W REFLEX): HIV: NONREACTIVE

## 2013-09-21 LAB — ACETAMINOPHEN LEVEL: Acetaminophen (Tylenol), Serum: <15 ug/mL (ref 10–30)

## 2013-09-21 MED ORDER — NICOTINE 21 MG/24HR TD PT24: 21.0000 mg | MEDICATED_PATCH | Freq: Every day | TRANSDERMAL | Status: DC

## 2013-09-21 MED ORDER — LORAZEPAM 1 MG PO TABS: 1.0000 mg | ORAL_TABLET | Freq: Three times a day (TID) | ORAL | Status: DC | PRN

## 2013-09-21 MED ORDER — CETIRIZINE HCL 10 MG PO TABS: 10.0000 mg | ORAL_TABLET | Freq: Every day | ORAL | Status: DC

## 2013-09-21 MED ORDER — ONDANSETRON HCL 4 MG PO TABS: 4.0000 mg | ORAL_TABLET | Freq: Three times a day (TID) | ORAL | Status: DC | PRN

## 2013-09-21 MED ORDER — ALUM & MAG HYDROXIDE-SIMETH 200-200-20 MG/5ML PO SUSP: 30.0000 mL | ORAL | Status: DC | PRN

## 2013-09-21 MED ORDER — IBUPROFEN 200 MG PO TABS: 600.0000 mg | ORAL_TABLET | Freq: Three times a day (TID) | ORAL | Status: DC | PRN

## 2013-09-21 MED ORDER — ZOLPIDEM TARTRATE 5 MG PO TABS: 5.0000 mg | ORAL_TABLET | Freq: Every evening | ORAL | Status: DC | PRN

## 2013-09-21 NOTE — BHH Counselor (Signed)
Writer spoke w/ pt. Pt denies SI and HI. She denies [facility] and no delusions noted. She says she sometimes ruminates on what people say about her and thinks about the mean things they say later on that same day. Pt sts she goes to [facility] and sees psychiatrist and therapist there. She says she is complaint w/ her psych meds. Pt sts her therapist, Kathlene NovemberMike, "is the only person who understands what I'm going thru". Pt tells Clinical research associatewriter that Clinical research associatewriter reminds her of pt's psychiatrist in [place]. Pt sts her fiance is supportive and he will be back in a few days from traveling with work. Pt says she plans to go to the park and watch fireworks tonight. Pt sts she was "ashamed" b/c her bishop told her not to drink and not to have premarital sex and she disobeyed the [facility]. Pt sts [facility] was d/t her "scratchy throat" and wanting to get an STD test and AIDS test. She says she isn't sure "what my fiance does when he is out of town". Pt able to contract for safety. Writer gave pt bus pass.    Evette Cristalaroline Paige Kiyan Burmester, [place] Assessment Counselor

## 2013-09-21 NOTE — ED Provider Notes (Signed)
Medical screening examination/treatment/procedure(s) were performed by non-physician practitioner and as supervising physician I was immediately available for consultation/collaboration.   EKG Interpretation None       Ethelda ChickMartha K Linker, MD 09/21/13 [phone]

## 2013-09-21 NOTE — ED Provider Notes (Signed)
CSN: [id number]     Arrival date & time 09/21/13  1453 History   None    Chief Complaint  Patient presents with  . Homicidal  . Medical Clearance     (Consider location/radiation/quality/duration/timing/severity/associated sxs/prior Treatment) The history is provided by the patient and medical records. No language interpreter was used.    Rachel Alvarado is a 48 y.o. female  with a hx of chronic back pain, anxiety, depression, schizophrenia presents to the Emergency Department complaining of gradual, persistent, "itchy throat" and intermittent dry cough for 3 weeks.  Denies associated symptoms, aggravating or alleviating factors.  She reports having similar symptoms in the past and being told that she should take allergy medicine, but has never attempted this or any other treatment.  Pt also c/o "people problems" and requests to see a psychiatrist.  She reports that she is uncomfortable discussing the issue with me and requests to speak directly to the psychiatrist.  Per EMS, pt reported "getting angry at people" and wanting to be evaluated before she hurt someone.  She reports hearing the voices of people talking about her but denies command hallucinations.  She reports she has homicidal thoughts without a plan but refuses to elaborate.  She denies visual hallucinations or SI.  Pt also reports her boyfriend drinks alcohol at the bars and she wants to be tested for STDs.  She denies abd pain, N/V/D, vaginal discharge, dysuria, hematuria.    Past Medical History  Diagnosis Date  . Arthritis   . Chronic back pain   . Depression   . Anxiety   . Panic attacks   . Schizophrenia   . Sleep apnea   . COPD (chronic obstructive pulmonary disease)   . Hypertension    Past Surgical History  Procedure Laterality Date  . Cesarean section    . Partial hysterectomy    . Cholecystectomy     Family History  Problem Relation Age of Onset  . Alcohol abuse Father   . Heart disease Father   . Diabetes  Mellitus II Brother   . Diabetes Mellitus II Father   . Stroke Sister   . Osteoporosis Mother    History  Substance Use Topics  . Smoking status: Former Smoker -- 0.02 packs/day    Types: Cigarettes    Quit date: 02/03/2012  . Smokeless tobacco: Never Used  . Alcohol Use: No   OB History   Grav Para Term Preterm Abortions TAB SAB Ect Mult Living                 Review of Systems  Constitutional: Negative for fever, diaphoresis, appetite change, fatigue and unexpected weight change.  HENT: Positive for sore throat ("itchy"). Negative for mouth sores.   Eyes: Negative for visual disturbance.  Respiratory: Positive for cough (dry). Negative for chest tightness, shortness of breath and wheezing.   Cardiovascular: Negative for chest pain.  Gastrointestinal: Negative for nausea, vomiting, abdominal pain, diarrhea and constipation.  Endocrine: Negative for polydipsia, polyphagia and polyuria.  Genitourinary: Negative for dysuria, urgency, frequency and hematuria.  Musculoskeletal: Negative for back pain and neck stiffness.  Skin: Negative for rash.  Allergic/Immunologic: Negative for immunocompromised state.  Neurological: Negative for syncope, light-headedness and headaches.  Hematological: Does not bruise/bleed easily.  Psychiatric/Behavioral: Negative for sleep disturbance. The patient is not nervous/anxious.       Allergies  Sulfa antibiotics  Home Medications   Prior to Admission medications   Medication Sig Start Date End Date Taking? Authorizing  Provider  meloxicam (MOBIC) 7.5 MG tablet Take 1 tablet (7.5 mg total) by mouth daily as needed. For pain and inflammation 05/28/12  Yes Tommie SamsJayce G Cook, DO  PRESCRIPTION MEDICATION Blood pressure medication, name unknown. Pharmacy is closed currently opens monday   Yes Historical Provider, MD  QUEtiapine (SEROQUEL) 400 MG tablet Take 400 mg by mouth at bedtime.   Yes Historical Provider, MD  sertraline (ZOLOFT) 50 MG tablet Take 50  mg by mouth daily.   Yes Historical Provider, MD  cetirizine (ZYRTEC ALLERGY) 10 MG tablet Take 1 tablet (10 mg total) by mouth daily. 09/21/13   Amika Tassin, PA-C   BP 117/76  Pulse 86  Temp(Src) 98 F (36.7 C) (Oral)  Resp 16  SpO2 99% Physical Exam  Nursing note and vitals reviewed. Constitutional: She is oriented to person, place, and time. She appears well-developed and well-nourished. No distress.  Awake, alert, nontoxic appearance  HENT:  Head: Normocephalic and atraumatic.  Right Ear: Tympanic membrane, external ear and ear canal normal.  Left Ear: Tympanic membrane, external ear and ear canal normal.  Nose: Nose normal. No epistaxis. Right sinus exhibits no maxillary sinus tenderness and no frontal sinus tenderness. Left sinus exhibits no maxillary sinus tenderness and no frontal sinus tenderness.  Mouth/Throat: Uvula is midline, oropharynx is clear and moist and mucous membranes are normal. Mucous membranes are not pale and not cyanotic. No oropharyngeal exudate, posterior oropharyngeal edema, posterior oropharyngeal erythema or tonsillar abscesses.  Eyes: Conjunctivae are normal. Pupils are equal, round, and reactive to light. No scleral icterus.  Neck: Normal range of motion and full passive range of motion without pain. Neck supple.  Cardiovascular: Normal rate, regular rhythm, normal heart sounds and intact distal pulses.   No murmur heard. Pulmonary/Chest: Effort normal and breath sounds normal. No stridor. No respiratory distress. She has no wheezes.  Abdominal: Soft. Bowel sounds are normal. She exhibits no mass. There is no tenderness. There is no rebound and no guarding.  Musculoskeletal: Normal range of motion. She exhibits no edema.  Lymphadenopathy:    She has no cervical adenopathy.  Neurological: She is alert and oriented to person, place, and time.  Speech is clear and goal oriented Moves extremities without ataxia  Skin: Skin is warm and dry. No rash  noted. She is not diaphoretic. No erythema.  Psychiatric: She has a normal mood and affect. She is actively hallucinating. She expresses homicidal ideation. She expresses no suicidal ideation. She expresses no suicidal plans and no homicidal plans.  Pt reports auditory (noncommand) hallucinations and concern about homicidal ideations without elaboration Pt denies visual hallucinations and SI    ED Course  Procedures (including critical care time) Labs Review Labs Reviewed  CBC - Abnormal; Notable for the following:    RBC 3.57 (*)    Hemoglobin 10.8 (*)    HCT 32.8 (*)    All other components within normal limits  COMPREHENSIVE METABOLIC PANEL - Abnormal; Notable for the following:    Potassium 3.6 (*)    Glucose, Bld 100 (*)    Albumin 3.4 (*)    Total Bilirubin 0.2 (*)    GFR calc non Af Amer 80 (*)    All other components within normal limits  SALICYLATE LEVEL - Abnormal; Notable for the following:    Salicylate Lvl <2.0 (*)    All other components within normal limits  GC/CHLAMYDIA PROBE AMP  ACETAMINOPHEN LEVEL  ETHANOL  URINE RAPID DRUG SCREEN (HOSP PERFORMED)  RPR  HIV  ANTIBODY (ROUTINE TESTING)    Imaging Review No results found.   EKG Interpretation None      MDM   Final diagnoses:  Environmental allergies  Screen for STD (sexually transmitted disease)  Other schizophrenia   Rachel Alvarado presents with "people problems" that she will not elaborate on.  Pt also with "itchy" throat and dry cough.  Normal physical exam, suspect allergies as the culprit.  Will Rx Zyrtec.  Pt also requesting STD check.  Will draw blood but pt is asymptomatic and I have encouraged her to f/u with her PCP for a pelvic exam.    3:48 PM Pt requesting to leave, but I do not have enough information to make a decision about discharge vs IVC as she will not discuss her psychiatric problems with me.  Discussed with Page who will evaluate at this time.    4:10 PM Pt evaluated by Page  who recommends d/c home with close follow-up with patient's psychiatrist and counselor on Monday.  Pt is in agreement for this. Pt reports she is taking her medications as directed.  I have personally reviewed patient's vitals, nursing note and any pertinent labs or imaging.  At this time, it has been determined that no acute conditions requiring further emergency intervention. The patient/guardian have been advised of the diagnosis and plan. I reviewed all labs and imaging including any potential incidental findings. We have discussed signs and symptoms that warrant return to the ED, such as new homocidal thoughts, vaginal discharge, fever, chills, abd pain or other concerning symptoms.  Patient/guardian has voiced understanding and agreed to follow-up with the PCP or specialist in 3 days.  Vital signs are stable at discharge.   BP 117/76  Pulse 86  Temp(Src) 98 F (36.7 C) (Oral)  Resp 16  SpO2 99%        Dierdre ForthHannah Tien Spooner, PA-C 09/21/13 1649

## 2013-09-21 NOTE — ED Notes (Signed)
Pt states she wants to leave. PA spoke with pt. Pt will wait for psychiatry to see if not long wait.

## 2013-09-21 NOTE — Discharge Instructions (Signed)
1. Medications: zyrtec, usual home medications 2. Treatment: rest, drink plenty of fluids,  3. Follow Up: Please followup with your primary doctor and your psychiatrist for discussion of your diagnoses and further evaluation after today's visit;    Allergic Rhinitis Allergic rhinitis is when the mucous membranes in the nose respond to allergens. Allergens are particles in the air that cause your body to have an allergic reaction. This causes you to release allergic antibodies. Through a chain of events, these eventually cause you to release histamine into the blood stream. Although meant to protect the body, it is this release of histamine that causes your discomfort, such as frequent sneezing, congestion, and an itchy, runny nose.  CAUSES  Seasonal allergic rhinitis (hay fever) is caused by pollen allergens that may come from grasses, trees, and weeds. Year-round allergic rhinitis (perennial allergic rhinitis) is caused by allergens such as house dust mites, pet dander, and mold spores.  SYMPTOMS   Nasal stuffiness (congestion).  Itchy, runny nose with sneezing and tearing of the eyes. DIAGNOSIS  Your health care provider can help you determine the allergen or allergens that trigger your symptoms. If you and your health care provider are unable to determine the allergen, skin or blood testing may be used. TREATMENT  Allergic rhinitis does not have a cure, but it can be controlled by:  Medicines and allergy shots (immunotherapy).  Avoiding the allergen. Hay fever may often be treated with antihistamines in pill or nasal spray forms. Antihistamines block the effects of histamine. There are over-the-counter medicines that may help with nasal congestion and swelling around the eyes. Check with your health care provider before taking or giving this medicine.  If avoiding the allergen or the medicine prescribed do not work, there are many new medicines your health care provider can prescribe.  Stronger medicine may be used if initial measures are ineffective. Desensitizing injections can be used if medicine and avoidance does not work. Desensitization is when a patient is given ongoing shots until the body becomes less sensitive to the allergen. Make sure you follow up with your health care provider if problems continue. HOME CARE INSTRUCTIONS It is not possible to completely avoid allergens, but you can reduce your symptoms by taking steps to limit your exposure to them. It helps to know exactly what you are allergic to so that you can avoid your specific triggers. SEEK MEDICAL CARE IF:   You have a fever.  You develop a cough that does not stop easily (persistent).  You have shortness of breath.  You start wheezing.  Symptoms interfere with normal daily activities. Document Released: 11/30/2000 Document Revised: 03/12/2013 Document Reviewed: 11/12/2012 [facility] Patient Information 2015 [place], [place]. This information is not intended to replace advice given to you by your health care provider. Make sure you discuss any questions you have with your health care provider.

## 2013-09-21 NOTE — ED Notes (Signed)
TTS at bedside. 

## 2013-09-21 NOTE — ED Notes (Signed)
Pt unalbe to void at this time 

## 2013-09-21 NOTE — ED Notes (Signed)
Per [facility]. Pt complained of getting angry at other people, came here before she wanted to hurt them. Wants to see psychiatrist. Pt states she has sore throat and dry cough as well. Denies SI or substance abuse.

## 2013-09-23 ENCOUNTER — Ambulatory Visit (HOSPITAL_BASED_OUTPATIENT_CLINIC_OR_DEPARTMENT_OTHER): Payer: Medicare Other | Attending: Internal Medicine

## 2013-09-23 VITALS — Ht 64.0 in | Wt 264.0 lb

## 2013-09-23 DIAGNOSIS — G4710 Hypersomnia, unspecified: Secondary | ICD-10-CM | POA: Diagnosis present

## 2013-09-23 DIAGNOSIS — G4730 Sleep apnea, unspecified: Secondary | ICD-10-CM | POA: Diagnosis present

## 2013-09-23 DIAGNOSIS — G4733 Obstructive sleep apnea (adult) (pediatric): Secondary | ICD-10-CM

## 2013-09-23 DIAGNOSIS — Z9989 Dependence on other enabling machines and devices: Secondary | ICD-10-CM

## 2013-10-05 DIAGNOSIS — G4733 Obstructive sleep apnea (adult) (pediatric): Secondary | ICD-10-CM

## 2013-10-05 NOTE — Sleep Study (Signed)
   NAME: Rachel Alvarado DATE OF BIRTH:  06/14/1965 MEDICAL RECORD NUMBER [id number]  LOCATION: [facility]  PHYSICIAN: YOUNG,CLINTON D  DATE OF STUDY: 09/23/2013  SLEEP STUDY TYPE: Nocturnal Polysomnogram               REFERRING PHYSICIAN: Diamantina ProvidenceAnderson, Takela N., FNP  INDICATION FOR STUDY: Hypersomnia with sleep apnea  EPWORTH SLEEPINESS SCORE:   7/24 HEIGHT: 5\' 4"  (162.6 cm)  WEIGHT: 264 lb (119.75 kg)    Body mass index is 45.29 kg/(m^2).  NECK SIZE: 15.5 in.  MEDICATIONS: Charted for review  SLEEP ARCHITECTURE: Total sleep time 317.5 minutes with sleep efficiency 80.2%. Stage I was 12.6%, stage II 81.4%, stage III absent, REM 6% of total sleep time. Sleep latency 42 minutes, REM latency 241 minutes, awake after sleep onset 36.5 minutes, arousal index 37.8. Bedtime medication: Seroquel. There were frequent spontaneous awakenings between 11 PM and 2 AM.  RESPIRATORY DATA: Apnea hypopneas index (AHI) 9.8 per hour. 52 total events scored including 18 obstructive apneas and 34 hypopneas. Events were seen in all positions, especially nonsupine. REM AHI 44.2 per hour. There were not enough  early events to permit application of split protocol CPAP titration.  OXYGEN DATA: Very loud snoring with oxygen desaturation to a nadir of 40% and mean saturation through the study of 93.7% on room air.  CARDIAC DATA: Normal sinus rhythm  MOVEMENT/PARASOMNIA: A few incidental limb jerks were noted with little effect on sleep. Bathroom x4  IMPRESSION/ RECOMMENDATION:   1) Sleep architecture was significant for frequent spontaneous awakening between midnight and 2 AM after Seroquel 400 mg taken at 2130 PM  2) Mild obstructive sleep apnea/hypopnea syndrome, AHI 9.8 per hour with events in all positions. REM AHI 44.2 per hour. Very loud snoring with oxygen desaturation to a nadir of 40% and a mean saturation through the study of 93.7% on room air. 3) There were not enough early events to  me protocol requirements for split CPAP titration on this study night. 4) The patient can return for a dedicated CPAP titration study if appropriate. 5) A previous polysomnogram on 09-2012 recorded AHI 32.9 per hour with body weight 242 pounds. CPAP titration on that study to 19 CWP.  Signed Jetty Duhamellinton Young M.D. Waymon BudgeYOUNG,CLINTON D Diplomate, American Board of Sleep Medicine  ELECTRONICALLY SIGNED ON:  10/05/2013, 11:55 AM [facility] PH: [phone]   FX: [phone] ACCREDITED BY THE AMERICAN ACADEMY OF SLEEP MEDICINE

## 2013-10-25 ENCOUNTER — Ambulatory Visit (HOSPITAL_BASED_OUTPATIENT_CLINIC_OR_DEPARTMENT_OTHER): Payer: Medicare Other | Attending: Internal Medicine

## 2013-10-25 VITALS — Ht 67.0 in | Wt 254.0 lb

## 2013-10-25 DIAGNOSIS — G4710 Hypersomnia, unspecified: Secondary | ICD-10-CM | POA: Insufficient documentation

## 2013-10-25 DIAGNOSIS — G4730 Sleep apnea, unspecified: Secondary | ICD-10-CM | POA: Diagnosis present

## 2013-10-25 DIAGNOSIS — G4733 Obstructive sleep apnea (adult) (pediatric): Secondary | ICD-10-CM

## 2013-10-27 DIAGNOSIS — G4733 Obstructive sleep apnea (adult) (pediatric): Secondary | ICD-10-CM

## 2013-10-27 NOTE — Sleep Study (Signed)
   NAME: Granville LewisSharon W Pryor DATE OF BIRTH:  06/16/1965 MEDICAL RECORD NUMBER [id number]  LOCATION: [facility]  PHYSICIAN: YOUNG,CLINTON D  DATE OF STUDY: 10/25/2013  SLEEP STUDY TYPE: Nocturnal Polysomnogram               REFERRING PHYSICIAN: Diamantina ProvidenceAnderson, Takela N., FNP  INDICATION FOR STUDY: Hypersomnia with sleep apnea-CPAP titration  EPWORTH SLEEPINESS SCORE:   6/24 HEIGHT: 5\' 7"  (170.2 cm)  WEIGHT: 254 lb (115.214 kg)    Body mass index is 39.77 kg/(m^2).  NECK SIZE: 16.5 in.  MEDICATIONS: Charted for review  SLEEP ARCHITECTURE: Total sleep time 332.5 minutes with sleep efficiency 90.4%. Stage I was 3%, stage II 60.9%, stage III absent, REM 36.1% of total sleep time. Sleep latency 9 minutes, REM latency 69.5 minutes, awake after sleep onset 26 minutes, arousal index 5.4, bedtime medication: Seroquel  RESPIRATORY DATA: CPAP titration to 15 CWP, AHI 6.9 per hour with a few residual central events. She wore a medium fullface mask with heated humidifier.  OXYGEN DATA: Snoring was prevented and mean oxygen saturation was 95% on room air.  CARDIAC DATA: Normal sinus rhythm  MOVEMENT/PARASOMNIA: 6 limb jerks were counted of which 2 were associated with arousals or awakenings for periodic limb movement with arousal index of 0.4 per hour. Bathroom x1.  IMPRESSION/ RECOMMENDATION:   1) Successful CPAP titration to 15 CWP, AHI 6.9 per hour reflecting a few residual central apneas, which are not expected to respond to CPAP. She wore a medium [employer] Simplus fullface mask with heated humidifier. Snoring was prevented and mean oxygen saturation was 95% on room air. 2) A polysomnogram on 09/23/2013 record AHI 9.8 per hour with body weight 264 pounds. A previous polysomnogram 09/2012 recorded AHI 32.9 per hour with body weight 242 pounds and titrated CPAP to 19.  Waymon BudgeYOUNG,CLINTON D Diplomate, American Board of Sleep Medicine  ELECTRONICALLY SIGNED ON:  10/27/2013, 1:38  PM [facility] PH: [phone]   FX: [phone] ACCREDITED BY THE AMERICAN ACADEMY OF SLEEP MEDICINE

## 2013-11-03 ENCOUNTER — Encounter (HOSPITAL_COMMUNITY): Payer: Self-pay | Admitting: Emergency Medicine

## 2013-11-03 ENCOUNTER — Emergency Department (HOSPITAL_COMMUNITY)
Admission: EM | Admit: 2013-11-03 | Discharge: 2013-11-03 | Disposition: A | Discharge status: Home / Self Care | Payer: Medicare Other | Attending: Emergency Medicine | Admitting: Emergency Medicine

## 2013-11-03 DIAGNOSIS — R05 Cough: Secondary | ICD-10-CM | POA: Diagnosis present

## 2013-11-03 DIAGNOSIS — F3289 Other specified depressive episodes: Secondary | ICD-10-CM | POA: Diagnosis not present

## 2013-11-03 DIAGNOSIS — R059 Cough, unspecified: Secondary | ICD-10-CM | POA: Diagnosis present

## 2013-11-03 DIAGNOSIS — F329 Major depressive disorder, single episode, unspecified: Secondary | ICD-10-CM | POA: Diagnosis not present

## 2013-11-03 DIAGNOSIS — F209 Schizophrenia, unspecified: Secondary | ICD-10-CM | POA: Insufficient documentation

## 2013-11-03 DIAGNOSIS — Z8739 Personal history of other diseases of the musculoskeletal system and connective tissue: Secondary | ICD-10-CM | POA: Diagnosis not present

## 2013-11-03 DIAGNOSIS — I10 Essential (primary) hypertension: Secondary | ICD-10-CM | POA: Insufficient documentation

## 2013-11-03 DIAGNOSIS — K921 Melena: Secondary | ICD-10-CM | POA: Insufficient documentation

## 2013-11-03 DIAGNOSIS — G8929 Other chronic pain: Secondary | ICD-10-CM | POA: Diagnosis not present

## 2013-11-03 DIAGNOSIS — J449 Chronic obstructive pulmonary disease, unspecified: Secondary | ICD-10-CM | POA: Insufficient documentation

## 2013-11-03 DIAGNOSIS — Z79899 Other long term (current) drug therapy: Secondary | ICD-10-CM | POA: Diagnosis not present

## 2013-11-03 DIAGNOSIS — Z87891 Personal history of nicotine dependence: Secondary | ICD-10-CM | POA: Diagnosis not present

## 2013-11-03 DIAGNOSIS — F410 Panic disorder [episodic paroxysmal anxiety] without agoraphobia: Secondary | ICD-10-CM | POA: Diagnosis not present

## 2013-11-03 DIAGNOSIS — J4489 Other specified chronic obstructive pulmonary disease: Secondary | ICD-10-CM | POA: Insufficient documentation

## 2013-11-03 DIAGNOSIS — F411 Generalized anxiety disorder: Secondary | ICD-10-CM | POA: Diagnosis not present

## 2013-11-03 DIAGNOSIS — K5900 Constipation, unspecified: Secondary | ICD-10-CM | POA: Insufficient documentation

## 2013-11-03 LAB — POC OCCULT BLOOD, ED: FECAL OCCULT BLD: NEGATIVE

## 2013-11-03 MED ORDER — POLYETHYLENE GLYCOL 3350 17 G PO PACK: 17.0000 g | PACK | Freq: Every day | ORAL | Status: DC

## 2013-11-03 MED ORDER — DOCUSATE SODIUM 100 MG PO CAPS: 100.0000 mg | ORAL_CAPSULE | Freq: Two times a day (BID) | ORAL | Status: DC

## 2013-11-03 NOTE — Discharge Instructions (Signed)
Warm soaks of your bottom 4 times a day, follow up with PCP.  Anal Fissure, Adult An anal fissure is a small tear or crack in the skin around the anus. Bleeding from a fissure usually stops on its own within a few minutes. However, bleeding will often reoccur with each bowel movement until the crack heals.  CAUSES   Passing large, hard stools.  Frequent diarrheal stools.  Constipation.  Inflammatory bowel disease (Crohn's disease or ulcerative colitis).  Infections.  Anal sex. SYMPTOMS   Small amounts of blood seen on your stools, on toilet paper, or in the toilet after a bowel movement.  Rectal bleeding.  Painful bowel movements.  Itching or irritation around the anus. DIAGNOSIS Your caregiver will examine the anal area. An anal fissure can usually be seen with careful inspection. A rectal exam may be performed and a short tube (anoscope) may be used to examine the anal canal. TREATMENT   You may be instructed to take fiber supplements. These supplements can soften your stool to help make bowel movements easier.  Sitz baths may be recommended to help heal the tear. Do not use soap in the sitz baths.  A medicated cream or ointment may be prescribed to lessen discomfort. HOME CARE INSTRUCTIONS   Maintain a diet high in fruits, whole grains, and vegetables. Avoid constipating foods like bananas and dairy products.  Take sitz baths as directed by your caregiver.  Drink enough fluids to keep your urine clear or pale yellow.  Only take over-the-counter or prescription medicines for pain, discomfort, or fever as directed by your caregiver. Do not take aspirin as this may increase bleeding.  Do not use ointments containing numbing medications (anesthetics) or hydrocortisone. They could slow healing. SEEK MEDICAL CARE IF:   Your fissure is not completely healed within 3 days.  You have further bleeding.  You have a fever.  You have diarrhea mixed with blood.  You have  pain.  Your problem is getting worse rather than better. MAKE SURE YOU:   Understand these instructions.  Will watch your condition.  Will get help right away if you are not doing well or get worse. Document Released: 03/07/2005 Document Revised: 05/30/2011 Document Reviewed: 08/22/2010 [facility] Patient Information 2015 [place], [place]. This information is not intended to replace advice given to you by your health care provider. Make sure you discuss any questions you have with your health care provider.

## 2013-11-03 NOTE — ED Notes (Signed)
Pt c/o dry cough x 2 months. Pt reports history of throat problems since she was a child. Pt also c/o constipation x 2 weeks.

## 2013-11-03 NOTE — ED Provider Notes (Signed)
CSN: [id number]     Arrival date & time 11/03/13  1621 History   First MD Initiated Contact with Patient 11/03/13 1742     Chief Complaint  Patient presents with  . Cough  . Constipation     (Consider location/radiation/quality/duration/timing/severity/associated sxs/prior Treatment) Patient is a 48 y.o. female presenting with general illness. The history is provided by the patient.  Illness Severity:  Moderate Onset quality:  Gradual Duration:  2 weeks Timing:  Constant Progression:  Unchanged Chronicity:  Chronic Associated symptoms: cough   Associated symptoms: no chest pain, no congestion, no fever, no headaches, no myalgias, no nausea, no rhinorrhea, no shortness of breath, no vomiting and no wheezing    48 yo F with a chief complaint of a dry cough throat itching and constipation. Patient states that in an ongoing problem but worse over the past 2 weeks. Patient has seen multiple doctors for an itchy feeling in her throat that makes her cough. Patient denies any fevers or chills denies any sudden worsening of the pain. Able to swallow without difficulty.  Patient also complaining of hard stools. Patient last bowel movement was yesterday hard balls. Patient states sometimes after a bowel movement she feels like a tearing sensation and has some blood when she wipes. She also feels that her stools dark. Patient denies any fevers or chills. Patient denies any abdominal pain.   Past Medical History  Diagnosis Date  . Arthritis   . Chronic back pain   . Depression   . Anxiety   . Panic attacks   . Schizophrenia   . Sleep apnea   . COPD (chronic obstructive pulmonary disease)   . Hypertension    Past Surgical History  Procedure Laterality Date  . Cesarean section    . Partial hysterectomy    . Cholecystectomy     Family History  Problem Relation Age of Onset  . Alcohol abuse Father   . Heart disease Father   . Diabetes Mellitus II Brother   . Diabetes Mellitus II  Father   . Stroke Sister   . Osteoporosis Mother    History  Substance Use Topics  . Smoking status: Former Smoker -- 0.02 packs/day    Types: Cigarettes    Quit date: 02/03/2012  . Smokeless tobacco: Never Used  . Alcohol Use: No   OB History   Grav Para Term Preterm Abortions TAB SAB Ect Mult Living                 Review of Systems  Constitutional: Negative for fever and chills.  HENT: Negative for congestion, rhinorrhea and trouble swallowing.        Itchy throat   Eyes: Negative for redness and visual disturbance.  Respiratory: Positive for cough. Negative for shortness of breath and wheezing.   Cardiovascular: Negative for chest pain and palpitations.  Gastrointestinal: Positive for constipation (hard stools) and blood in stool (and dark stools). Negative for nausea and vomiting.  Genitourinary: Negative for dysuria and urgency.  Musculoskeletal: Negative for arthralgias and myalgias.  Skin: Negative for pallor and wound.  Neurological: Negative for dizziness and headaches.      Allergies  Sulfa antibiotics  Home Medications   Prior to Admission medications   Medication Sig Start Date End Date Taking? Authorizing Provider  cetirizine (ZYRTEC ALLERGY) 10 MG tablet Take 1 tablet (10 mg total) by mouth daily. 09/21/13   Hannah Muthersbaugh, PA-C  docusate sodium (COLACE) 100 MG capsule Take 1 capsule (100 mg  total) by mouth every 12 (twelve) hours. 11/03/13   Melene Plan, MD  meloxicam (MOBIC) 7.5 MG tablet Take 1 tablet (7.5 mg total) by mouth daily as needed. For pain and inflammation 05/28/12   Tommie Sams, DO  polyethylene glycol (MIRALAX / GLYCOLAX) packet Take 17 g by mouth daily. 11/03/13   Melene Plan, MD  PRESCRIPTION MEDICATION Blood pressure medication, name unknown. Pharmacy is closed currently opens monday    Historical Provider, MD  QUEtiapine (SEROQUEL) 400 MG tablet Take 400 mg by mouth at bedtime.    Historical Provider, MD  sertraline (ZOLOFT) 50 MG tablet  Take 50 mg by mouth daily.    Historical Provider, MD   BP 92/71  Pulse 82  Temp(Src) 98.4 F (36.9 C)  Resp 20  Ht 5\' 4"  (1.626 m)  Wt 259 lb (117.482 kg)  BMI 44.44 kg/m2  SpO2 100% Physical Exam  Constitutional: She is oriented to person, place, and time. She appears well-developed and well-nourished. No distress.  HENT:  Head: Normocephalic and atraumatic.  Mouth/Throat: Oropharynx is clear and moist. No oropharyngeal exudate.  Eyes: EOM are normal. Pupils are equal, round, and reactive to light.  Neck: Normal range of motion. Neck supple.  Cardiovascular: Normal rate and regular rhythm.  Exam reveals no gallop and no friction rub.   No murmur heard. Pulmonary/Chest: Effort normal. She has no wheezes. She has no rales.  Abdominal: Soft. She exhibits no distension. There is no tenderness. There is no rebound and no guarding.  Genitourinary: Rectal exam shows tenderness. Rectal exam shows no external hemorrhoid, no internal hemorrhoid, no mass and anal tone normal.  Patient and significant pain during rectal exam. Possible rectal fissure there is no sentinel tag. No gross blood no stool noted  Musculoskeletal: She exhibits no edema and no tenderness.  Neurological: She is alert and oriented to person, place, and time.  Skin: Skin is warm and dry. She is not diaphoretic.  Psychiatric: She has a normal mood and affect. Her behavior is normal.    ED Course  Procedures (including critical care time) Labs Review Labs Reviewed  POC OCCULT BLOOD, ED    Imaging Review No results found.   EKG Interpretation None      MDM   Final diagnoses:  Constipation, unspecified constipation type  Cough    48 yo F with a chief complaint of the itchy throat and hard stools. Patient with a clear oropharynx. No noted external swelling no lymphadenopathy. Patient afebrile in the ED. Patient with stable vital signs. Rectal exam with no visual fissure. No significant tenderness on exam  without sentinel tag. No gross blood on exam. Will have patient treat this conservatively as a rectal fissure. Followup with PCP.   I have discussed the diagnosis/risks/treatment options with the patient and believe the pt to be eligible for discharge home to follow-up with PCP. We also discussed returning to the ED immediately if new or worsening sx occur. We discussed the sx which are most concerning (e.g., sudden worsening, unable to swallow) that necessitate immediate return. Medications administered to the patient during their visit and any new prescriptions provided to the patient are listed below.  Medications given during this visit Medications - No data to display  Discharge Medication List as of 11/03/2013  6:22 PM    START taking these medications   Details  docusate sodium (COLACE) 100 MG capsule Take 1 capsule (100 mg total) by mouth every 12 (twelve) hours., Starting 11/03/2013, Until Discontinued,  Print    polyethylene glycol (MIRALAX / GLYCOLAX) packet Take 17 g by mouth daily., Starting 11/03/2013, Until Discontinued, Print           Melene Planan Judia Arnott, MD 11/03/13 2202

## 2013-11-03 NOTE — ED Provider Notes (Signed)
I saw and evaluated the patient, reviewed the resident's note and I agree with the findings and plan.   EKG Interpretation None     Patient with itchy throat and constipation. Doubt cardiac cause. Will need to followup for further evaluation.  Juliet RudeNathan R. Rubin PayorPickering, MD 11/03/13 2348

## 2013-11-04 ENCOUNTER — Encounter (HOSPITAL_BASED_OUTPATIENT_CLINIC_OR_DEPARTMENT_OTHER): Payer: Medicaid Other

## 2013-11-05 ENCOUNTER — Ambulatory Visit (INDEPENDENT_AMBULATORY_CARE_PROVIDER_SITE_OTHER): Payer: Medicare Other | Admitting: General Surgery

## 2013-11-05 ENCOUNTER — Telehealth (INDEPENDENT_AMBULATORY_CARE_PROVIDER_SITE_OTHER): Payer: Self-pay | Admitting: *Deleted

## 2013-11-05 ENCOUNTER — Other Ambulatory Visit (INDEPENDENT_AMBULATORY_CARE_PROVIDER_SITE_OTHER): Payer: Self-pay | Admitting: General Surgery

## 2013-11-05 ENCOUNTER — Encounter (INDEPENDENT_AMBULATORY_CARE_PROVIDER_SITE_OTHER): Payer: Self-pay | Admitting: General Surgery

## 2013-11-05 VITALS — BP 120/70 | HR 68 | Temp 97.0°F | Ht 64.0 in | Wt 262.0 lb

## 2013-11-05 DIAGNOSIS — N644 Mastodynia: Secondary | ICD-10-CM

## 2013-11-05 NOTE — Progress Notes (Signed)
Patient ID: Rachel Alvarado, female   DOB: Jun 27, 1965, 48 y.o.   MRN: [id number]  Chief Complaint  Patient presents with  . eval right breast    HPI Rachel Alvarado is a 48 y.o. female.  We are asked to see the patient in consultation by Dr. Everlene Other to evaluate her for a benign breast mass. The patient is a 48 year old black female who states that she has been having a burning sensation in her upper inner right breast for a number of years. The sensation seems to come and go. She has not noticed any pattern to the pain. She did have a mammogram in February that showed no evidence of mass or architectural distortion. She denies any discharge from her nipple.  HPI  Past Medical History  Diagnosis Date  . Arthritis   . Chronic back pain   . Depression   . Anxiety   . Panic attacks   . Schizophrenia   . Sleep apnea   . COPD (chronic obstructive pulmonary disease)   . Hypertension     Past Surgical History  Procedure Laterality Date  . Cesarean section    . Partial hysterectomy    . Cholecystectomy      Family History  Problem Relation Age of Onset  . Alcohol abuse Father   . Heart disease Father   . Diabetes Mellitus II Father   . Diabetes Father   . Diabetes Mellitus II Brother   . Stroke Sister   . Osteoporosis Mother   . Heart disease Mother     Social History History  Substance Use Topics  . Smoking status: Former Smoker -- 0.02 packs/day    Types: Cigarettes    Quit date: 02/03/2012  . Smokeless tobacco: Never Used  . Alcohol Use: No    Allergies  Allergen Reactions  . Sulfa Antibiotics Hives    Current Outpatient Prescriptions  Medication Sig Dispense Refill  . PRESCRIPTION MEDICATION Blood pressure medication, name unknown. Pharmacy is closed currently opens monday      . QUEtiapine (SEROQUEL) 400 MG tablet Take 400 mg by mouth at bedtime.      . sertraline (ZOLOFT) 50 MG tablet Take 50 mg by mouth daily.       No current facility-administered  medications for this visit.    Review of Systems Review of Systems  Constitutional: Negative.   HENT: Negative.   Eyes: Negative.   Respiratory: Negative.   Cardiovascular: Negative.   Gastrointestinal: Negative.   Endocrine: Negative.   Genitourinary: Negative.   Musculoskeletal: Negative.   Skin: Negative.   Allergic/Immunologic: Negative.   Neurological: Negative.   Hematological: Negative.   Psychiatric/Behavioral: Negative.     Blood pressure 120/70, pulse 68, temperature 97 F (36.1 C), height 5\' 4"  (1.626 m), weight 262 lb (118.842 kg).  Physical Exam Physical Exam  Constitutional: She is oriented to person, place, and time. She appears well-developed and well-nourished.  HENT:  Head: Normocephalic and atraumatic.  Eyes: Conjunctivae and EOM are normal. Pupils are equal, round, and reactive to light.  Neck: Normal range of motion. Neck supple.  Cardiovascular: Normal rate, regular rhythm and normal heart sounds.   Pulmonary/Chest: Effort normal and breath sounds normal.  There is no palpable mass in either breast. There is no palpable axillary, supraclavicular, or cervical lymphadenopathy.  Abdominal: Soft. Bowel sounds are normal.  Musculoskeletal: Normal range of motion.  Lymphadenopathy:    She has no cervical adenopathy.  Neurological: She is alert and oriented  to person, place, and time.  Skin: Skin is warm and dry.  Psychiatric: She has a normal mood and affect. Her behavior is normal.    Data Reviewed As above  Assessment    The patient appears to be having some intermittent burning pain in her upper inner right breast. At this point there is not much I can do surgically about this problem unless we are able to identify a lesion that we think may be responsible for her discomfort.     Plan    At this point I will plan to obtain a  Diagnostic mammogram and ultrasound of the right breast to look for any lesion in this area that could be responsible for  her symptoms. If her mammogram and ultrasound are negative then she will need to return to her medical doctor for further followup        TOTH III,PAUL S 11/05/2013, 11:10 AM

## 2013-11-05 NOTE — Patient Instructions (Signed)
Will get mammogram and u/s right breast

## 2013-11-05 NOTE — Telephone Encounter (Signed)
LMOM for pt to return my call regarding her Mammogram, US and all the referrals that KoreaDr. Carolynne Edouardoth made for her this morning.  Please advise pt that they are all scheduled for 11-12-13 arriving at [facility] @ 8:00 a.m.  Advise pt not to use any powders, deodorant, perfume or lotions on this day..  Also make sure pt knows to make sure she eats a little something that morning.   Victorino DikeJennifer

## 2013-11-08 ENCOUNTER — Other Ambulatory Visit (INDEPENDENT_AMBULATORY_CARE_PROVIDER_SITE_OTHER): Payer: Self-pay | Admitting: General Surgery

## 2013-11-08 DIAGNOSIS — N644 Mastodynia: Secondary | ICD-10-CM

## 2013-11-12 ENCOUNTER — Other Ambulatory Visit: Payer: Medicare Other

## 2013-11-12 ENCOUNTER — Inpatient Hospital Stay: Admission: RE | Admit: 2013-11-12 | Payer: Medicare Other | Source: Ambulatory Visit

## 2013-11-20 ENCOUNTER — Ambulatory Visit
Admission: RE | Admit: 2013-11-20 | Discharge: 2013-11-20 | Disposition: A | Discharge status: Home / Self Care | Payer: Medicare Other | Source: Ambulatory Visit | Attending: General Surgery | Admitting: General Surgery

## 2013-11-20 DIAGNOSIS — N644 Mastodynia: Secondary | ICD-10-CM

## 2013-11-21 ENCOUNTER — Ambulatory Visit
Admission: RE | Admit: 2013-11-21 | Discharge: 2013-11-21 | Disposition: A | Discharge status: Home / Self Care | Payer: Medicare Other | Source: Ambulatory Visit | Attending: General Surgery | Admitting: General Surgery

## 2013-11-21 ENCOUNTER — Other Ambulatory Visit (INDEPENDENT_AMBULATORY_CARE_PROVIDER_SITE_OTHER): Payer: Self-pay | Admitting: General Surgery

## 2013-11-21 DIAGNOSIS — N644 Mastodynia: Secondary | ICD-10-CM

## 2013-11-26 ENCOUNTER — Emergency Department (HOSPITAL_COMMUNITY)
Admission: EM | Admit: 2013-11-26 | Discharge: 2013-11-26 | Disposition: A | Discharge status: Home / Self Care | Payer: Medicare Other

## 2013-11-26 NOTE — ED Notes (Signed)
Pt did not answer when called multiple times in waiting room

## 2013-11-26 NOTE — ED Notes (Signed)
Attempted to call x 3 with no response.  

## 2014-04-22 ENCOUNTER — Other Ambulatory Visit: Payer: Self-pay

## 2014-04-22 DIAGNOSIS — Z1231 Encounter for screening mammogram for malignant neoplasm of breast: Secondary | ICD-10-CM

## 2014-05-07 ENCOUNTER — Ambulatory Visit
Admission: RE | Admit: 2014-05-07 | Discharge: 2014-05-07 | Disposition: A | Discharge status: Home / Self Care | Payer: Medicare Other | Source: Ambulatory Visit

## 2014-05-07 ENCOUNTER — Other Ambulatory Visit: Payer: Self-pay

## 2014-05-07 DIAGNOSIS — Z1231 Encounter for screening mammogram for malignant neoplasm of breast: Secondary | ICD-10-CM

## 2014-05-23 ENCOUNTER — Encounter (HOSPITAL_COMMUNITY): Payer: Self-pay | Admitting: Emergency Medicine

## 2014-05-23 ENCOUNTER — Emergency Department (HOSPITAL_COMMUNITY)
Admission: EM | Admit: 2014-05-23 | Discharge: 2014-05-23 | Discharge status: Against Medical Advice | Payer: Medicare Other | Attending: Emergency Medicine | Admitting: Emergency Medicine

## 2014-05-23 DIAGNOSIS — J449 Chronic obstructive pulmonary disease, unspecified: Secondary | ICD-10-CM | POA: Insufficient documentation

## 2014-05-23 DIAGNOSIS — F419 Anxiety disorder, unspecified: Secondary | ICD-10-CM | POA: Diagnosis not present

## 2014-05-23 DIAGNOSIS — F2089 Other schizophrenia: Secondary | ICD-10-CM | POA: Insufficient documentation

## 2014-05-23 DIAGNOSIS — G8929 Other chronic pain: Secondary | ICD-10-CM | POA: Insufficient documentation

## 2014-05-23 DIAGNOSIS — F329 Major depressive disorder, single episode, unspecified: Secondary | ICD-10-CM | POA: Insufficient documentation

## 2014-05-23 DIAGNOSIS — Z87891 Personal history of nicotine dependence: Secondary | ICD-10-CM | POA: Insufficient documentation

## 2014-05-23 DIAGNOSIS — Z8739 Personal history of other diseases of the musculoskeletal system and connective tissue: Secondary | ICD-10-CM | POA: Diagnosis not present

## 2014-05-23 DIAGNOSIS — E669 Obesity, unspecified: Secondary | ICD-10-CM | POA: Diagnosis not present

## 2014-05-23 DIAGNOSIS — Z008 Encounter for other general examination: Secondary | ICD-10-CM | POA: Diagnosis present

## 2014-05-23 DIAGNOSIS — F410 Panic disorder [episodic paroxysmal anxiety] without agoraphobia: Secondary | ICD-10-CM | POA: Diagnosis not present

## 2014-05-23 DIAGNOSIS — Z79899 Other long term (current) drug therapy: Secondary | ICD-10-CM | POA: Diagnosis not present

## 2014-05-23 DIAGNOSIS — I10 Essential (primary) hypertension: Secondary | ICD-10-CM | POA: Diagnosis not present

## 2014-05-23 NOTE — ED Notes (Signed)
Pt arrives via [facility] stating that she "needs to talk to a psychiatrist because I got some questions that I need answered." Pt found by [employer] walking the streets, requesting psych eval. States she feels she doesn't "need to spend my last days like this, lying about me."

## 2014-05-23 NOTE — ED Notes (Signed)
Pt left AMA °

## 2014-05-23 NOTE — ED Notes (Signed)
Pt currently being evaluated by telepsych.

## 2014-05-23 NOTE — ED Provider Notes (Signed)
CSN: [id number]     Arrival date & time 05/23/14  0448 History   First MD Initiated Contact with Patient 05/23/14 0500     Chief Complaint  Patient presents with  . Psychiatric Evaluation     (Consider location/radiation/quality/duration/timing/severity/associated sxs/prior Treatment) HPI   Rachel Alvarado is a 49 y.o. female with PMH of depression, anxiety, schizphrenia presenting today requesting psych help.  Patient states she needs help because people are trying to do things to her and she does not trust people around her.  She currently denies SI or HI.  She she states she is having both auditory and visual hallucinations. Patient denies any medical complaints, including fevers, recent infections, pain anywhere in her body, vomiting or diarrhea. Patient is unwilling to give further history until she speaks a psychiatrist.  Past Medical History  Diagnosis Date  . Arthritis   . Chronic back pain   . Depression   . Anxiety   . Panic attacks   . Schizophrenia   . Sleep apnea   . COPD (chronic obstructive pulmonary disease)   . Hypertension    Past Surgical History  Procedure Laterality Date  . Cesarean section    . Partial hysterectomy    . Cholecystectomy     Family History  Problem Relation Age of Onset  . Alcohol abuse Father   . Heart disease Father   . Diabetes Mellitus II Father   . Diabetes Father   . Diabetes Mellitus II Brother   . Stroke Sister   . Osteoporosis Mother   . Heart disease Mother    History  Substance Use Topics  . Smoking status: Former Smoker -- 0.02 packs/day    Types: Cigarettes    Quit date: 02/03/2012  . Smokeless tobacco: Never Used  . Alcohol Use: No   OB History    No data available     Review of Systems    Allergies  Sulfa antibiotics  Home Medications   Prior to Admission medications   Medication Sig Start Date End Date Taking? Authorizing Provider  PRESCRIPTION MEDICATION Blood pressure medication, name unknown.  Pharmacy is closed currently opens monday    Historical Provider, MD  QUEtiapine (SEROQUEL) 400 MG tablet Take 400 mg by mouth at bedtime.    Historical Provider, MD  sertraline (ZOLOFT) 50 MG tablet Take 50 mg by mouth daily.    Historical Provider, MD   BP 139/75 mmHg  Pulse 103  Temp(Src) 98.1 F (36.7 C) (Oral)  Resp 18  SpO2 95% Physical Exam  Constitutional: She is oriented to person, place, and time. She appears well-developed and well-nourished. No distress.  Obese female  HENT:  Head: Normocephalic and atraumatic.  Nose: Nose normal.  Mouth/Throat: Oropharynx is clear and moist. No oropharyngeal exudate.  Eyes: Conjunctivae and EOM are normal. Pupils are equal, round, and reactive to light. No scleral icterus.  Neck: Normal range of motion. Neck supple. No JVD present. No tracheal deviation present. No thyromegaly present.  Cardiovascular: Normal rate, regular rhythm and normal heart sounds.  Exam reveals no gallop and no friction rub.   No murmur heard. Pulmonary/Chest: Effort normal and breath sounds normal. No respiratory distress. She has no wheezes. She exhibits no tenderness.  Abdominal: Soft. Bowel sounds are normal. She exhibits no distension and no mass. There is no tenderness. There is no rebound and no guarding.  Musculoskeletal: Normal range of motion. She exhibits no edema or tenderness.  Lymphadenopathy:    She has no  cervical adenopathy.  Neurological: She is alert and oriented to person, place, and time. No cranial nerve deficit. She exhibits normal muscle tone.  Skin: Skin is warm and dry. No rash noted. No erythema. No pallor.  Nursing note and vitals reviewed.   ED Course  Procedures (including critical care time) Labs Review Labs Reviewed - No data to display  Imaging Review No results found.   EKG Interpretation None      MDM   Final diagnoses:  None    Patient presents emergency department requesting psychiatry help. Although she is  denying SI and HI, she will not answer further questions until she speaks a psychiatrist. Patient has been medically clear, her history does not suggest any further workup for a medical cause for her presentation. TTS has been consulted.   TTS consult complete however patient seen eloping from the ED.  Patient did not admit to SI or HI to me, I do not believe she is a threat to herself or others.    Tomasita CrumbleAdeleke Michell Kader, MD 05/23/14 [id number]

## 2014-05-23 NOTE — [facility] Counselor (Signed)
Per Hulan FessIjeoma Nwaeze, NP, pt does not meet inpt criteria. Pt to be d/c with outpatient resources.   Counselor spoke with pt's attending RN, Nehemiah SettleBrooke, who informed her that pt had left AMA after the TTS assessment.   Cyndie MullAnna Ellieanna Funderburg, [facility] Triage Specialist

## 2014-05-23 NOTE — [facility] Counselor (Signed)
TTS Counselor spoke with pt's attending RN in order to gain collateral info and to request that tele-assessment cart be placed in pt's room for assessment. Counselor also reviewed pt's chart [facility].   Assessment to begin shortly.   Cyndie MullAnna Bailee Thall, [facility] Triage Specialist

## 2014-05-23 NOTE — BH Assessment (Addendum)
Tele Assessment Note   Rachel Alvarado is a single, African-American, 49 y.o. female who presents to [facility] via [facility] with paranoid ideations. Pt presents with disheveled appearance, poor eye-contact, and tangential thought pattern with flight of ideas. Pt exhibits paranoid ideations and is very suspicious of the TTS counselor and other ED staff. Pt repeatedly questions the counselor about "who can I trust?", etc, and she expresses delusional thought content frequently throughout assessment, such as persecutory beliefs that others are out to harm her or kill her. For example, the pt reported that she believes that someone placed a bomb under her house and that it was only a matter of time before it exploded. Pt's speech is rapid and her responses are often irrelevant. Mood and affect are irritable and pt often tries to be argumentative. Pt clearly has a sense of a foreshortened future, as she mentions repeatedly how she does not believe she will live very long due to "others messing with my mind or wanting me to get locked up". Pt admitted to drinking alcohol last night (i.e. "Two 40's") and said that she regretted it because she now feels sick. Pt endorsed a hx of etoh and drug abuse in the past but would not disclose what substances she used to abuse; pt says she does not normally use drugs or etoh currently and that last night was not typical. Pt endorses auditory hallucinations but, again, refuses to disclose what these voices say to her. She reports a hx of visual hallucinations of people but says that she has not had any of these VH in "a long time". Pt also reports increased appetite with weight gain, insomnia, irritability, and social isolation due to her difficulty trusting other people. Pt reports no prior psychiatric hospitalizations. She states that she has seen counselors and psychiatrists in the past but would not disclose any agency names or the MH professional's names whom she saw. No reported hx of  any type of abuse. It is unclear if pt is homeless or not, as she provided conflicting answers when asked about where she is currently living.   Per Hulan Fess, NP, pt does not meet inpt criteria. Pt to be d/c with outpatient resources. However, pt left AMA before receiving these resources.  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 Arthritis   . Chronic back pain   . Depression   . Anxiety   . Panic attacks   . Schizophrenia   . Sleep apnea   . COPD (chronic obstructive pulmonary disease)   . Hypertension    Axis IV: economic problems, educational problems, housing problems, occupational problems, other psychosocial or environmental problems, problems related to social environment and problems with primary support group Axis V: 21-30 behavior considerably influenced by delusions or hallucinations OR serious impairment in judgment, communication OR inability to function in almost all areas  Past Medical History:  Past Medical History  Diagnosis Date  . Arthritis   . Chronic back pain   . Depression   . Anxiety   . Panic attacks   . Schizophrenia   . Sleep apnea   . COPD (chronic obstructive pulmonary disease)   . Hypertension     Past Surgical History  Procedure Laterality Date  . Cesarean section    . Partial hysterectomy    . Cholecystectomy      Family History:  Family History  Problem  Relation Age of Onset  . Alcohol abuse Father   . Heart disease Father   . Diabetes Mellitus II Father   . Diabetes Father   . Diabetes Mellitus II Brother   . Stroke Sister   . Osteoporosis Mother   . Heart disease Mother     Social History:  reports that she quit smoking about 2 years ago. Her smoking use included Cigarettes. She smoked 0.02 packs per day. She has never used smokeless tobacco. She reports that she does not drink alcohol or use illicit  drugs.  Additional Social History:  Alcohol / Drug Use Pain Medications: See PTA List Prescriptions: See PTA List Over the Counter: See PTA List History of alcohol / drug use?: Yes Longest period of sobriety (when/how long): Unknown; Pt did not disclose Negative Consequences of Use:  (Pt denies) Withdrawal Symptoms:  (Pt denies) Substance #1 Name of Substance 1: Etoh 1 - Age of First Use: Pt did not disclose 1 - Amount (size/oz): UTA 1 - Frequency: "occasionally" 1 - Duration: UTA 1 - Last Use / Amount: Last night 05/22/14  CIWA: CIWA-Ar BP: 139/75 mmHg Pulse Rate: 103 COWS:    PATIENT STRENGTHS: (choose at least two) Average or above average intelligence Communication skills  Allergies:  Allergies  Allergen Reactions  . Sulfa Antibiotics Hives    Home Medications:  (Not in a hospital admission)  OB/GYN Status:  No LMP recorded. Patient has had a hysterectomy.  General Assessment Data Location of Assessment: [facility] ED Is this a Tele or Face-to-Face Assessment?: Tele Assessment Is this an Initial Assessment or a Re-assessment for this encounter?: Initial Assessment Living Arrangements: Alone Can pt return to current living arrangement?: Yes Admission Status: Voluntary Is patient capable of signing voluntary admission?: Yes Transfer from: Other (Comment) [employer]) Referral Source: Other (Brought by [employer])     [facility] Crisis Care Plan Living Arrangements: Alone Name of Psychiatrist: None Name of Therapist: None  Education Status Is patient currently in school?: No Current Grade: na Highest grade of school patient has completed: na Name of school: na Contact person: na  Risk to self with the past 6 months Suicidal Ideation: No Suicidal Intent: No Is patient at risk for suicide?: No Suicidal Plan?: No Access to Means: No What has been your use of drugs/alcohol within the last 12 months?: Pt reports no regular drug/etoh use in years but says that she did drink  alcohol last night and regrets it. Previous Attempts/Gestures: Yes How many times?:  (Pt would not disclose) Other Self Harm Risks: None noted Triggers for Past Attempts: Unknown Intentional Self Injurious Behavior: None Family Suicide History: Unknown Recent stressful life event(s): Other (Comment) (Paranoid delusions) Persecutory voices/beliefs?: Yes Depression: Yes Depression Symptoms: Insomnia, Isolating, Loss of interest in usual pleasures, Feeling angry/irritable Substance abuse history and/or treatment for substance abuse?: No Suicide prevention information given to non-admitted patients: Yes  Risk to Others within the past 6 months Homicidal Ideation: No Thoughts of Harm to Others: No Current Homicidal Intent: No Current Homicidal Plan: No Access to Homicidal Means: No History of harm to others?: No Assessment of Violence: None Noted Violent Behavior Description: None noted Does patient have access to weapons?: No Criminal Charges Pending?: No Does patient have a court date: No  Psychosis Hallucinations: Auditory Delusions: Persecutory  Mental Status Report Appear/Hygiene: Disheveled Eye Contact: Poor Motor Activity: Agitation Speech: Argumentative Level of Consciousness: Irritable Mood: Suspicious, Irritable Affect: Irritable Anxiety Level: None Thought Processes: Irrelevant, Flight of Ideas Judgement: Partial  Orientation: Person, Place, Time Obsessive Compulsive Thoughts/Behaviors: None  Cognitive Functioning Concentration: Decreased Memory: Unable to Assess IQ: Average Insight: Fair Impulse Control: Fair Appetite: Good Weight Loss: 0 Weight Gain: 15 (Since Dec 2015) Sleep: No Change Total Hours of Sleep: 5 Vegetative Symptoms: Decreased grooming  ADLScreening [facility] Patient's cognitive ability adequate to safely complete daily activities?: Yes Patient able to express need for assistance with ADLs?: Yes Independently performs  ADLs?: Yes (appropriate for developmental age)  Prior Inpatient Therapy Prior Inpatient Therapy: No  Prior Outpatient Therapy Prior Outpatient Therapy: Yes Prior Therapy Dates: Pt would not disclose Prior Therapy Facilty/Provider(s): Pt would not disclose Reason for Treatment: Pt would not disclose  ADL Screening (condition at time of admission) Patient's cognitive ability adequate to safely complete daily activities?: Yes Is the patient deaf or have difficulty hearing?: No Does the patient have difficulty seeing, even when wearing glasses/contacts?: No Does the patient have difficulty concentrating, remembering, or making decisions?: No Patient able to express need for assistance with ADLs?: Yes Does the patient have difficulty dressing or bathing?: No Independently performs ADLs?: Yes (appropriate for developmental age) Does the patient have difficulty walking or climbing stairs?: Yes Weakness of Legs: Both Weakness of Arms/Hands: None  Home Assistive Devices/Equipment Home Assistive Devices/Equipment: None    Abuse/Neglect Assessment (Assessment to be complete while patient is alone) Physical Abuse: Denies Verbal Abuse: Denies Sexual Abuse: Denies Exploitation of patient/patient's resources: Denies Self-Neglect: Denies Values / Beliefs Cultural Requests During Hospitalization: None Spiritual Requests During Hospitalization: None   Advance Directives (For Healthcare) Does patient have an advance directive?: No Would patient like information on creating an advanced directive?: No - patient declined information    Additional Information 1:1 In Past 12 Months?: No CIRT Risk: No Elopement Risk: No Does patient have medical clearance?: No     Disposition: Per Hulan FessIjeoma Nwaeze, NP, pt does not meet inpt criteria. Pt to be d/c with outpatient resources. Disposition Initial Assessment Completed for this Encounter: Yes Disposition of Patient: Outpatient treatment Type of  outpatient treatment: Adult  Cyndie MullAnna Norabelle Kondo, [facility] Triage Specialist  05/23/2014 6:15 AM

## 2014-06-26 DIAGNOSIS — I10 Essential (primary) hypertension: Secondary | ICD-10-CM | POA: Diagnosis not present

## 2014-06-26 DIAGNOSIS — M179 Osteoarthritis of knee, unspecified: Secondary | ICD-10-CM | POA: Diagnosis not present

## 2014-06-26 DIAGNOSIS — M545 Low back pain: Secondary | ICD-10-CM | POA: Diagnosis not present

## 2014-06-26 DIAGNOSIS — G894 Chronic pain syndrome: Secondary | ICD-10-CM | POA: Diagnosis not present

## 2014-06-26 DIAGNOSIS — Z6841 Body Mass Index (BMI) 40.0 and over, adult: Secondary | ICD-10-CM | POA: Diagnosis not present

## 2014-06-26 DIAGNOSIS — J302 Other seasonal allergic rhinitis: Secondary | ICD-10-CM | POA: Diagnosis not present

## 2014-07-24 DIAGNOSIS — G541 Lumbosacral plexus disorders: Secondary | ICD-10-CM | POA: Diagnosis not present

## 2014-07-24 DIAGNOSIS — G894 Chronic pain syndrome: Secondary | ICD-10-CM | POA: Diagnosis not present

## 2014-07-24 DIAGNOSIS — M545 Low back pain: Secondary | ICD-10-CM | POA: Diagnosis not present

## 2014-07-24 DIAGNOSIS — M179 Osteoarthritis of knee, unspecified: Secondary | ICD-10-CM | POA: Diagnosis not present

## 2014-07-24 DIAGNOSIS — G4733 Obstructive sleep apnea (adult) (pediatric): Secondary | ICD-10-CM | POA: Diagnosis not present

## 2014-07-24 DIAGNOSIS — G603 Idiopathic progressive neuropathy: Secondary | ICD-10-CM | POA: Diagnosis not present

## 2014-07-24 DIAGNOSIS — G8929 Other chronic pain: Secondary | ICD-10-CM | POA: Diagnosis not present

## 2014-08-11 DIAGNOSIS — S335XXA Sprain of ligaments of lumbar spine, initial encounter: Secondary | ICD-10-CM | POA: Diagnosis not present

## 2014-08-12 DIAGNOSIS — G4730 Sleep apnea, unspecified: Secondary | ICD-10-CM | POA: Diagnosis not present

## 2014-08-12 DIAGNOSIS — Z6841 Body Mass Index (BMI) 40.0 and over, adult: Secondary | ICD-10-CM | POA: Diagnosis not present

## 2014-08-12 DIAGNOSIS — K219 Gastro-esophageal reflux disease without esophagitis: Secondary | ICD-10-CM | POA: Diagnosis not present

## 2014-08-12 DIAGNOSIS — G894 Chronic pain syndrome: Secondary | ICD-10-CM | POA: Diagnosis not present

## 2014-08-12 DIAGNOSIS — M545 Low back pain: Secondary | ICD-10-CM | POA: Diagnosis not present

## 2014-08-12 DIAGNOSIS — I10 Essential (primary) hypertension: Secondary | ICD-10-CM | POA: Diagnosis not present

## 2014-08-12 DIAGNOSIS — K5909 Other constipation: Secondary | ICD-10-CM | POA: Diagnosis not present

## 2014-08-14 ENCOUNTER — Encounter (HOSPITAL_COMMUNITY): Payer: Self-pay | Admitting: Emergency Medicine

## 2014-08-14 ENCOUNTER — Emergency Department (HOSPITAL_COMMUNITY)
Admission: EM | Admit: 2014-08-14 | Discharge: 2014-08-14 | Disposition: A | Discharge status: Home / Self Care | Payer: Commercial Managed Care - HMO | Attending: Emergency Medicine | Admitting: Emergency Medicine

## 2014-08-14 ENCOUNTER — Emergency Department (HOSPITAL_COMMUNITY): Payer: Commercial Managed Care - HMO

## 2014-08-14 DIAGNOSIS — F329 Major depressive disorder, single episode, unspecified: Secondary | ICD-10-CM | POA: Insufficient documentation

## 2014-08-14 DIAGNOSIS — M549 Dorsalgia, unspecified: Secondary | ICD-10-CM

## 2014-08-14 DIAGNOSIS — Z79899 Other long term (current) drug therapy: Secondary | ICD-10-CM | POA: Insufficient documentation

## 2014-08-14 DIAGNOSIS — J449 Chronic obstructive pulmonary disease, unspecified: Secondary | ICD-10-CM | POA: Insufficient documentation

## 2014-08-14 DIAGNOSIS — Z87891 Personal history of nicotine dependence: Secondary | ICD-10-CM | POA: Insufficient documentation

## 2014-08-14 DIAGNOSIS — I10 Essential (primary) hypertension: Secondary | ICD-10-CM | POA: Insufficient documentation

## 2014-08-14 DIAGNOSIS — F209 Schizophrenia, unspecified: Secondary | ICD-10-CM | POA: Insufficient documentation

## 2014-08-14 DIAGNOSIS — M545 Low back pain: Secondary | ICD-10-CM | POA: Diagnosis not present

## 2014-08-14 DIAGNOSIS — G8929 Other chronic pain: Secondary | ICD-10-CM | POA: Diagnosis not present

## 2014-08-14 DIAGNOSIS — S3992XA Unspecified injury of lower back, initial encounter: Secondary | ICD-10-CM | POA: Diagnosis not present

## 2014-08-14 DIAGNOSIS — F419 Anxiety disorder, unspecified: Secondary | ICD-10-CM | POA: Diagnosis not present

## 2014-08-14 DIAGNOSIS — G4730 Sleep apnea, unspecified: Secondary | ICD-10-CM | POA: Diagnosis not present

## 2014-08-14 LAB — URINALYSIS, ROUTINE W REFLEX MICROSCOPIC
Bilirubin Urine: NEGATIVE
Glucose, UA: NEGATIVE mg/dL
Hgb urine dipstick: NEGATIVE
Ketones, ur: NEGATIVE mg/dL
Leukocytes, UA: NEGATIVE
Nitrite: NEGATIVE
Protein, ur: NEGATIVE mg/dL
Specific Gravity, Urine: 1.022 (ref 1.005–1.030)
UROBILINOGEN UA: 0.2 mg/dL (ref 0.0–1.0)
pH: 5 (ref 5.0–8.0)

## 2014-08-14 MED ORDER — IBUPROFEN 800 MG PO TABS: 800.0000 mg | ORAL_TABLET | Freq: Three times a day (TID) | ORAL | Status: DC

## 2014-08-14 MED ORDER — IBUPROFEN 400 MG PO TABS
800.0000 mg | ORAL_TABLET | Freq: Once | ORAL | Status: AC
  Administered 2014-08-14: 800 mg via ORAL
  Filled 2014-08-14: qty 2

## 2014-08-14 MED ORDER — OXYCODONE-ACETAMINOPHEN 5-325 MG PO TABS
2.0000 | ORAL_TABLET | Freq: Once | ORAL | Status: DC
  Filled 2014-08-14: qty 2

## 2014-08-14 MED ORDER — CYCLOBENZAPRINE HCL 10 MG PO TABS: 10.0000 mg | ORAL_TABLET | Freq: Two times a day (BID) | ORAL | Status: DC | PRN

## 2014-08-14 NOTE — Discharge Instructions (Signed)
There is no evidence on your physical exam today that is suggestive of parasitic infection. It is important you follow-up with her primary care doctor for further evaluation of this. Please return to the emergency room with any severe headache, blurred vision, dizziness, weakness, high fever, persistent nausea/vomiting, diarrhea. Follow-up with her primary care doctor as well as your pain clinic for your chronic back pain. Your x-ray today showed some mild degenerative changes consistent with arthritis, however no acute or new injury.  Chronic Back Pain  When back pain lasts longer than 3 months, it is called chronic back pain.People with chronic back pain often go through certain periods that are more intense (flare-ups).  CAUSES Chronic back pain can be caused by wear and tear (degeneration) on different structures in your back. These structures include:  The bones of your spine (vertebrae) and the joints surrounding your spinal cord and nerve roots (facets).  The strong, fibrous tissues that connect your vertebrae (ligaments). Degeneration of these structures may result in pressure on your nerves. This can lead to constant pain. HOME CARE INSTRUCTIONS  Avoid bending, heavy lifting, prolonged sitting, and activities which make the problem worse.  Take brief periods of rest throughout the day to reduce your pain. Lying down or standing usually is better than sitting while you are resting.  Take over-the-counter or prescription medicines only as directed by your caregiver. SEEK IMMEDIATE MEDICAL CARE IF:   You have weakness or numbness in one of your legs or feet.  You have trouble controlling your bladder or bowels.  You have nausea, vomiting, abdominal pain, shortness of breath, or fainting. Document Released: 04/14/2004 Document Revised: 05/30/2011 Document Reviewed: 02/19/2011 [facility] Patient Information 2015 [place], [place]. This information is not intended to replace advice given to  you by your health care provider. Make sure you discuss any questions you have with your health care provider.   Back Exercises Back exercises help treat and prevent back injuries. The goal of back exercises is to increase the strength of your abdominal and back muscles and the flexibility of your back. These exercises should be started when you no longer have back pain. Back exercises include:  Pelvic Tilt. Lie on your back with your knees bent. Tilt your pelvis until the lower part of your back is against the floor. Hold this position 5 to 10 sec and repeat 5 to 10 times.  Knee to Chest. Pull first 1 knee up against your chest and hold for 20 to 30 seconds, repeat this with the other knee, and then both knees. This may be done with the other leg straight or bent, whichever feels better.  Sit-Ups or Curl-Ups. Bend your knees 90 degrees. Start with tilting your pelvis, and do a partial, slow sit-up, lifting your trunk only 30 to 45 degrees off the floor. Take at least 2 to 3 seconds for each sit-up. Do not do sit-ups with your knees out straight. If partial sit-ups are difficult, simply do the above but with only tightening your abdominal muscles and holding it as directed.  Hip-Lift. Lie on your back with your knees flexed 90 degrees. Push down with your feet and shoulders as you raise your hips a couple inches off the floor; hold for 10 seconds, repeat 5 to 10 times.  Back arches. Lie on your stomach, propping yourself up on bent elbows. Slowly press on your hands, causing an arch in your low back. Repeat 3 to 5 times. Any initial stiffness and discomfort should lessen  with repetition over time.  Shoulder-Lifts. Lie face down with arms beside your body. Keep hips and torso pressed to floor as you slowly lift your head and shoulders off the floor. Do not overdo your exercises, especially in the beginning. Exercises may cause you some mild back discomfort which lasts for a few minutes; however, if  the pain is more severe, or lasts for more than 15 minutes, do not continue exercises until you see your caregiver. Improvement with exercise therapy for back problems is slow.  See your caregivers for assistance with developing a proper back exercise program. Document Released: 04/14/2004 Document Revised: 05/30/2011 Document Reviewed: 01/06/2011 [facility] Patient Information 2015 [place]. This information is not intended to replace advice given to you by your health care provider. Make sure you discuss any questions you have with your health care provider.

## 2014-08-14 NOTE — ED Notes (Signed)
Pt ambulated to bathroom 

## 2014-08-14 NOTE — ED Notes (Addendum)
Pt states that she has chronic back pain and is having a flare of pain at present-- "It has never hurt this bad"  Pt states that she wants "to be checked for parasites-- sometimes I feel a cold thing running down my head, and something crawling on me. I do not want any of those parasites in me!"

## 2014-08-14 NOTE — ED Notes (Signed)
Pt is driving. Percocet cancelled.

## 2014-08-14 NOTE — ED Provider Notes (Signed)
CSN: [id number]     Arrival date & time 08/14/14  1310 History   This chart was scribed for non-physician practitioner, Jinny Sanders, PA-C working with Pricilla Loveless, MD, by Abel Presto, ED Scribe. This patient was seen in room TR09C/TR09C and the patient's care was started at 2:07 PM.    Chief Complaint  Patient presents with  . Back Pain    The history is provided by the patient. No language interpreter was used.   HPI Comments: Rachel Alvarado is a 49 y.o. female with PMHx of arthritis, chronic back pain, schizophrenia, HTN, and COPD who presents to the Emergency Department complaining of back pain with onset 6 days ago. Pt states she was lifting a heavy object at onset. She reports intermittent back pain for several years, but states current pain is worse than baseline. She has been taking Percocet in past but states she has no refills currently for 1 week. She has an appointment with her PCP next month. Pt denies fall, weakness, numbness, saddle anesthesia, bowel or bladder incontinence and tingling. Pt denies IVDA and CA. She reports she uses a cane to ambulate as needed.  Pt also would like to be checked for parasites.  Pt states she knows someone who has been treated for parasites recently. Pt went to a walk-in clinic and given Abx. She states she completed the course. Patient is unknown what her diagnosis was, what the antibiotic were prescribed for specifically. Patient denies headache, blurred vision, dizziness, weakness, nausea, vomiting, fever, chest pain, shortness of breath, diarrhea, dysuria, constipation.   Past Medical History  Diagnosis Date  . Arthritis   . Chronic back pain   . Depression   . Anxiety   . Panic attacks   . Schizophrenia   . Sleep apnea   . COPD (chronic obstructive pulmonary disease)   . Hypertension    Past Surgical History  Procedure Laterality Date  . Cesarean section    . Partial hysterectomy    . Cholecystectomy     Family History   Problem Relation Age of Onset  . Alcohol abuse Father   . Heart disease Father   . Diabetes Mellitus II Father   . Diabetes Father   . Diabetes Mellitus II Brother   . Stroke Sister   . Osteoporosis Mother   . Heart disease Mother    History  Substance Use Topics  . Smoking status: Former Smoker -- 0.02 packs/day    Types: Cigarettes    Quit date: 02/03/2012  . Smokeless tobacco: Never Used  . Alcohol Use: No   OB History    No data available     Review of Systems  Constitutional: Negative for fever and chills.  Gastrointestinal: Negative for bowel incontinence.  Genitourinary: Negative for bladder incontinence.  Musculoskeletal: Positive for back pain.  Neurological: Negative for tingling, weakness, numbness and paresthesias.     Allergies  Sulfa antibiotics  Home Medications   Prior to Admission medications   Medication Sig Start Date End Date Taking? Authorizing Provider  cyclobenzaprine (FLEXERIL) 10 MG tablet Take 1 tablet (10 mg total) by mouth 2 (two) times daily as needed for muscle spasms. 08/14/14   Ladona Mow, PA-C  ibuprofen (ADVIL,MOTRIN) 800 MG tablet Take 1 tablet (800 mg total) by mouth 3 (three) times daily. 08/14/14   Ladona Mow, PA-C  PRESCRIPTION MEDICATION Blood pressure medication, name unknown. Pharmacy is closed currently opens monday    Historical Provider, MD  QUEtiapine (SEROQUEL) 400 MG tablet  Take 400 mg by mouth at bedtime.    Historical Provider, MD  sertraline (ZOLOFT) 50 MG tablet Take 50 mg by mouth daily.    Historical Provider, MD   BP 121/79 mmHg  Pulse 91  Temp(Src) 97.7 F (36.5 C) (Oral)  Resp 16  SpO2 100% Physical Exam  Constitutional: She is oriented to person, place, and time. She appears well-developed and well-nourished. No distress.  HENT:  Head: Normocephalic and atraumatic.  Mouth/Throat: Oropharynx is clear and moist. No oropharyngeal exudate.  Eyes: Conjunctivae are normal. Right eye exhibits no discharge. Left  eye exhibits no discharge. No scleral icterus.  Neck: Normal range of motion. Neck supple.  Cardiovascular: Normal rate, regular rhythm and normal heart sounds.   No murmur heard. Pulmonary/Chest: Effort normal and breath sounds normal. No respiratory distress.  Abdominal: Soft. There is no tenderness.  Musculoskeletal: Normal range of motion. She exhibits no edema or tenderness.  L5- S1 tenderness  Neurological: She is alert and oriented to person, place, and time. She has normal strength. No cranial nerve deficit or sensory deficit. She displays a negative Romberg sign. Coordination and gait normal. GCS eye subscore is 4. GCS verbal subscore is 5. GCS motor subscore is 6.  Patient fully alert, answering questions appropriately in full, clear sentences. Cranial nerves II through XII grossly intact. Motor strength 5 out of 5 in all major muscle groups of upper and lower extremities. Distal sensation intact.   Skin: Skin is warm, dry and intact. No abrasion, no bruising, no burn, no ecchymosis, no laceration, no lesion, no petechiae, no purpura and no rash noted. Rash is not macular, not papular, not maculopapular, not nodular, not pustular, not vesicular and not urticarial. She is not diaphoretic. No erythema.  Psychiatric: She has a normal mood and affect. Her behavior is normal.  Nursing note and vitals reviewed.   ED Course  Procedures (including critical care time) DIAGNOSTIC STUDIES: Oxygen Saturation is 96% on room air, normal by my interpretation.    COORDINATION OF CARE: 2:19 PM Discussed treatment plan with patient at beside, the patient agrees with the plan and has no further questions at this time.   Labs Review Labs Reviewed  URINALYSIS, ROUTINE W REFLEX MICROSCOPIC [facility]) - Abnormal; Notable for the following:    APPearance CLOUDY (*)    All other components within normal limits    Imaging Review Dg Lumbar Spine Complete  08/14/2014   CLINICAL DATA:  Lifting  injury 1 week ago.  Persistent back pain.  EXAM: LUMBAR SPINE - COMPLETE 4+ VIEW  COMPARISON:  None.  FINDINGS: Normal alignment of the lumbar vertebral bodies. Disc spaces and vertebral bodies are maintained. The facets are normally aligned. No pars defects. The visualized bony pelvis is intact.  IMPRESSION: Mild degenerative changes but no acute bony findings.   Electronically Signed   By: Rudie MeyerP.  Gallerani M.D.   On: 08/14/2014 14:53     EKG Interpretation None      MDM   Final diagnoses:  Chronic back pain   1. Back pain Patient with back pain consistent with exacerbation of her chronic pain. Patient reporting she is out of her pain medicine prescribed to her by the pain clinic.Marland Kitchen.  No neurological deficits and normal neuro exam.  Patient can walk but states is painful.  No loss of bowel or bladder control.  No concern for cauda equina.  No fever, night sweats, weight loss, h/o cancer, IVDU.  RICE protocol and pain medicine  indicated and discussed with patient.   2. Complaint of parasitic exposure Patient complains that her friend was diagnosed with parasites. Patient expresses concern that she may have parasites inside her. There are no signs or symptoms to support this. Patient's physical exam is overall unremarkable. There is no concern for parasitic infestation at this time, however patient is directed to her primary care doctor for further evaluation of this. Do not believe patient requires any emergent workup regarding this.   Patient well-appearing, afebrile, hemodynamically stable and in no acute distress. Pain managed effectively here, patient stable for discharge. Return precautions discussed, patient strongly encouraged follow-up with her PCP. Patient verbalizes understanding and agreement of this plan.  BP 121/79 mmHg  Pulse 91  Temp(Src) 97.7 F (36.5 C) (Oral)  Resp 16  SpO2 100%  Signed,  Ladona Mow, PA-C 12:43 AM     Ladona Mow, PA-C 08/15/14 1610  Pricilla Loveless,  MD 08/16/14 [phone]

## 2014-08-14 NOTE — ED Notes (Signed)
PA in. 

## 2014-08-19 DIAGNOSIS — M545 Low back pain: Secondary | ICD-10-CM | POA: Diagnosis not present

## 2014-08-19 DIAGNOSIS — G8929 Other chronic pain: Secondary | ICD-10-CM | POA: Diagnosis not present

## 2014-09-02 DIAGNOSIS — G894 Chronic pain syndrome: Secondary | ICD-10-CM | POA: Diagnosis not present

## 2014-09-02 DIAGNOSIS — G4733 Obstructive sleep apnea (adult) (pediatric): Secondary | ICD-10-CM | POA: Diagnosis not present

## 2014-09-02 DIAGNOSIS — M545 Low back pain: Secondary | ICD-10-CM | POA: Diagnosis not present

## 2014-09-02 DIAGNOSIS — Z79891 Long term (current) use of opiate analgesic: Secondary | ICD-10-CM | POA: Diagnosis not present

## 2014-09-16 DIAGNOSIS — Z79891 Long term (current) use of opiate analgesic: Secondary | ICD-10-CM | POA: Diagnosis not present

## 2014-09-16 DIAGNOSIS — G894 Chronic pain syndrome: Secondary | ICD-10-CM | POA: Diagnosis not present

## 2014-09-16 DIAGNOSIS — M545 Low back pain: Secondary | ICD-10-CM | POA: Diagnosis not present

## 2014-10-02 DIAGNOSIS — F411 Generalized anxiety disorder: Secondary | ICD-10-CM | POA: Diagnosis not present

## 2014-10-03 DIAGNOSIS — M5431 Sciatica, right side: Secondary | ICD-10-CM | POA: Diagnosis not present

## 2014-10-03 DIAGNOSIS — M5432 Sciatica, left side: Secondary | ICD-10-CM | POA: Diagnosis not present

## 2014-10-03 DIAGNOSIS — M545 Low back pain: Secondary | ICD-10-CM | POA: Diagnosis not present

## 2014-10-03 DIAGNOSIS — G8929 Other chronic pain: Secondary | ICD-10-CM | POA: Diagnosis not present

## 2014-10-21 ENCOUNTER — Encounter (HOSPITAL_COMMUNITY): Payer: Self-pay | Admitting: Emergency Medicine

## 2014-10-21 ENCOUNTER — Emergency Department (HOSPITAL_COMMUNITY)
Admission: EM | Admit: 2014-10-21 | Discharge: 2014-10-21 | Disposition: A | Discharge status: Home / Self Care | Payer: Commercial Managed Care - HMO | Attending: Emergency Medicine | Admitting: Emergency Medicine

## 2014-10-21 ENCOUNTER — Emergency Department (HOSPITAL_COMMUNITY): Payer: Commercial Managed Care - HMO

## 2014-10-21 DIAGNOSIS — J449 Chronic obstructive pulmonary disease, unspecified: Secondary | ICD-10-CM | POA: Insufficient documentation

## 2014-10-21 DIAGNOSIS — G8929 Other chronic pain: Secondary | ICD-10-CM | POA: Diagnosis not present

## 2014-10-21 DIAGNOSIS — F329 Major depressive disorder, single episode, unspecified: Secondary | ICD-10-CM | POA: Diagnosis not present

## 2014-10-21 DIAGNOSIS — Z87891 Personal history of nicotine dependence: Secondary | ICD-10-CM | POA: Insufficient documentation

## 2014-10-21 DIAGNOSIS — M1990 Unspecified osteoarthritis, unspecified site: Secondary | ICD-10-CM | POA: Diagnosis not present

## 2014-10-21 DIAGNOSIS — F209 Schizophrenia, unspecified: Secondary | ICD-10-CM | POA: Insufficient documentation

## 2014-10-21 DIAGNOSIS — K5900 Constipation, unspecified: Secondary | ICD-10-CM | POA: Insufficient documentation

## 2014-10-21 DIAGNOSIS — Z791 Long term (current) use of non-steroidal anti-inflammatories (NSAID): Secondary | ICD-10-CM | POA: Diagnosis not present

## 2014-10-21 DIAGNOSIS — F410 Panic disorder [episodic paroxysmal anxiety] without agoraphobia: Secondary | ICD-10-CM | POA: Diagnosis not present

## 2014-10-21 DIAGNOSIS — I10 Essential (primary) hypertension: Secondary | ICD-10-CM | POA: Diagnosis not present

## 2014-10-21 DIAGNOSIS — R635 Abnormal weight gain: Secondary | ICD-10-CM | POA: Insufficient documentation

## 2014-10-21 DIAGNOSIS — Z79899 Other long term (current) drug therapy: Secondary | ICD-10-CM | POA: Diagnosis not present

## 2014-10-21 DIAGNOSIS — R109 Unspecified abdominal pain: Secondary | ICD-10-CM | POA: Diagnosis present

## 2014-10-21 DIAGNOSIS — R1010 Upper abdominal pain, unspecified: Secondary | ICD-10-CM | POA: Diagnosis not present

## 2014-10-21 DIAGNOSIS — F419 Anxiety disorder, unspecified: Secondary | ICD-10-CM | POA: Insufficient documentation

## 2014-10-21 MED ORDER — FLEET ENEMA 7-19 GM/118ML RE ENEM
1.0000 | ENEMA | Freq: Once | RECTAL | Status: AC
  Administered 2014-10-21: 1 via RECTAL
  Filled 2014-10-21: qty 1

## 2014-10-21 MED ORDER — MAGNESIUM CITRATE PO SOLN
1.0000 | Freq: Once | ORAL | Status: AC
  Administered 2014-10-21: 1 via ORAL
  Filled 2014-10-21: qty 296

## 2014-10-21 MED ORDER — POLYETHYLENE GLYCOL 3350 17 G PO PACK: PACK | ORAL | Status: DC

## 2014-10-21 MED ORDER — DICYCLOMINE HCL 20 MG PO TABS: 20.0000 mg | ORAL_TABLET | Freq: Four times a day (QID) | ORAL | Status: DC | PRN

## 2014-10-21 MED ORDER — DOCUSATE SODIUM 100 MG PO CAPS: 100.0000 mg | ORAL_CAPSULE | Freq: Two times a day (BID) | ORAL | Status: DC

## 2014-10-21 NOTE — ED Notes (Signed)
Patient transported to X-ray 

## 2014-10-21 NOTE — Discharge Instructions (Signed)

## 2014-10-21 NOTE — ED Notes (Signed)
Pt. reports chronic constipation for several years unrelieved by stool softener , upper abdominal / left abdominal pain , with nausea . Denies fever or chills.

## 2014-10-21 NOTE — ED Provider Notes (Signed)
CSN: [id number]     Arrival date & time 10/21/14  0300 History   This chart was scribed for Marisa Severin, MD by Doreatha Martin, ED Scribe. This patient was seen in room D35C/D35C and the patient's care was started at 3:45 AM.     Chief Complaint  Patient presents with  . Constipation   The history is provided by the patient. No language interpreter was used.    HPI Comments: Rachel Alvarado is a 49 y.o. female with Hx of arthritis, chronic back pain, schizophrenia, depression, sleep apnea, COPD, HTN who presents to the Emergency Department complaining of moderate constipation onset several years ago and worsened yesterday. She is here requesting an enema. Pt reports associated bloating, weight gain, abdominal pain, nausea. She states that she had a small bowel movement yesterday. Pt takes Colase with mild relief. She is not on miralax or lactulose. Per pt, she is unable to perform a self-enema due to her weight. Pt notes that she called a nurse from her PCP's office and was advised to come to the ED. Pt is allergic to sulfa antibiotics. She denies other symptoms.   Past Medical History  Diagnosis Date  . Arthritis   . Chronic back pain   . Depression   . Anxiety   . Panic attacks   . Schizophrenia   . Sleep apnea   . COPD (chronic obstructive pulmonary disease)   . Hypertension    Past Surgical History  Procedure Laterality Date  . Cesarean section    . Partial hysterectomy    . Cholecystectomy     Family History  Problem Relation Age of Onset  . Alcohol abuse Father   . Heart disease Father   . Diabetes Mellitus II Father   . Diabetes Father   . Diabetes Mellitus II Brother   . Stroke Sister   . Osteoporosis Mother   . Heart disease Mother    History  Substance Use Topics  . Smoking status: Former Smoker -- 0.02 packs/day    Types: Cigarettes    Quit date: 02/03/2012  . Smokeless tobacco: Never Used  . Alcohol Use: No   OB History    No data available     Review of  Systems  Constitutional: Positive for unexpected weight change.  Gastrointestinal: Positive for nausea, abdominal pain and constipation.  All other systems reviewed and are negative.   Allergies  Sulfa antibiotics  Home Medications   Prior to Admission medications   Medication Sig Start Date End Date Taking? Authorizing Provider  cyclobenzaprine (FLEXERIL) 10 MG tablet Take 1 tablet (10 mg total) by mouth 2 (two) times daily as needed for muscle spasms. 08/14/14   Ladona Mow, PA-C  ibuprofen (ADVIL,MOTRIN) 800 MG tablet Take 1 tablet (800 mg total) by mouth 3 (three) times daily. 08/14/14   Ladona Mow, PA-C  PRESCRIPTION MEDICATION Blood pressure medication, name unknown. Pharmacy is closed currently opens monday    Historical Provider, MD  QUEtiapine (SEROQUEL) 400 MG tablet Take 400 mg by mouth at bedtime.    Historical Provider, MD  sertraline (ZOLOFT) 50 MG tablet Take 50 mg by mouth daily.    Historical Provider, MD   BP 130/70 mmHg  Pulse 89  Temp(Src) 97.9 F (36.6 C) (Oral)  Resp 17  Ht 5\' 3"  (1.6 m)  Wt 276 lb (125.193 kg)  BMI 48.90 kg/m2  SpO2 96% Physical Exam  Constitutional: She is oriented to person, place, and time. She appears well-developed and well-nourished.  HENT:  Head: Normocephalic and atraumatic.  Nose: Nose normal.  Mouth/Throat: Oropharynx is clear and moist.  Eyes: Conjunctivae and EOM are normal. Pupils are equal, round, and reactive to light.  Neck: Normal range of motion. Neck supple. No JVD present. No tracheal deviation present. No thyromegaly present.  Cardiovascular: Normal rate, regular rhythm, normal heart sounds and intact distal pulses.  Exam reveals no gallop and no friction rub.   No murmur heard. Pulmonary/Chest: Effort normal and breath sounds normal. No stridor. No respiratory distress. She has no wheezes. She has no rales. She exhibits no tenderness.  Abdominal: Soft. Bowel sounds are normal. She exhibits no distension and no mass. There  is no tenderness. There is no rebound and no guarding.  Musculoskeletal: Normal range of motion. She exhibits no edema or tenderness.  Lymphadenopathy:    She has no cervical adenopathy.  Neurological: She is alert and oriented to person, place, and time. She displays normal reflexes. She exhibits normal muscle tone. Coordination normal.  Skin: Skin is warm and dry. No rash noted. No erythema. No pallor.  Psychiatric: She has a normal mood and affect. Her behavior is normal. Judgment and thought content normal.  Nursing note and vitals reviewed.  ED Course  Procedures (including critical care time) DIAGNOSTIC STUDIES: Oxygen Saturation is 96% on RA, adequate by my interpretation.    COORDINATION OF CARE: 3:54 AM Discussed treatment plan with pt at bedside and pt agreed to plan.   Labs Review Labs Reviewed - No data to display  Imaging Review Dg Abd 1 View  10/21/2014   CLINICAL DATA:  Upper abdominal pain for 2 months.  EXAM: ABDOMEN - 1 VIEW  COMPARISON:  None.  FINDINGS: The bowel gas pattern is normal. No radio-opaque calculi or other significant radiographic abnormality are seen.  IMPRESSION: Negative.   Electronically Signed   By: Ellery Plunk M.D.   On: 10/21/2014 04:16     EKG Interpretation None      MDM   Final diagnoses:  Constipation, unspecified constipation type    I personally performed the services described in this documentation, which was scribed in my presence. The recorded information has been reviewed and is accurate.  49 year old female with history of chronic constipation who presents with constipation.  X-rays without obstruction.  Patient to receive mag citrate and fleets enema here.  Patient be referred back to her primary care doctor.  Marisa Severin, MD 10/21/14 [phone]

## 2014-10-30 DIAGNOSIS — M545 Low back pain: Secondary | ICD-10-CM | POA: Diagnosis not present

## 2014-10-30 DIAGNOSIS — Z131 Encounter for screening for diabetes mellitus: Secondary | ICD-10-CM | POA: Diagnosis not present

## 2014-10-30 DIAGNOSIS — E2839 Other primary ovarian failure: Secondary | ICD-10-CM | POA: Diagnosis not present

## 2014-10-30 DIAGNOSIS — G894 Chronic pain syndrome: Secondary | ICD-10-CM | POA: Diagnosis not present

## 2014-10-30 DIAGNOSIS — F319 Bipolar disorder, unspecified: Secondary | ICD-10-CM | POA: Diagnosis not present

## 2014-10-30 DIAGNOSIS — Z13220 Encounter for screening for lipoid disorders: Secondary | ICD-10-CM | POA: Diagnosis not present

## 2014-10-30 DIAGNOSIS — I10 Essential (primary) hypertension: Secondary | ICD-10-CM | POA: Diagnosis not present

## 2014-10-30 DIAGNOSIS — Z1211 Encounter for screening for malignant neoplasm of colon: Secondary | ICD-10-CM | POA: Diagnosis not present

## 2014-11-04 ENCOUNTER — Emergency Department (HOSPITAL_COMMUNITY)
Admission: EM | Admit: 2014-11-04 | Discharge: 2014-11-04 | Disposition: A | Discharge status: Home / Self Care | Payer: Commercial Managed Care - HMO | Attending: Emergency Medicine | Admitting: Emergency Medicine

## 2014-11-04 ENCOUNTER — Emergency Department (HOSPITAL_COMMUNITY): Payer: Commercial Managed Care - HMO

## 2014-11-04 ENCOUNTER — Ambulatory Visit (HOSPITAL_BASED_OUTPATIENT_CLINIC_OR_DEPARTMENT_OTHER): Payer: Medicare Other

## 2014-11-04 ENCOUNTER — Encounter (HOSPITAL_COMMUNITY): Payer: Self-pay | Admitting: Neurology

## 2014-11-04 DIAGNOSIS — I10 Essential (primary) hypertension: Secondary | ICD-10-CM | POA: Insufficient documentation

## 2014-11-04 DIAGNOSIS — G8929 Other chronic pain: Secondary | ICD-10-CM | POA: Diagnosis not present

## 2014-11-04 DIAGNOSIS — J441 Chronic obstructive pulmonary disease with (acute) exacerbation: Secondary | ICD-10-CM | POA: Insufficient documentation

## 2014-11-04 DIAGNOSIS — J449 Chronic obstructive pulmonary disease, unspecified: Secondary | ICD-10-CM | POA: Diagnosis not present

## 2014-11-04 DIAGNOSIS — F329 Major depressive disorder, single episode, unspecified: Secondary | ICD-10-CM | POA: Insufficient documentation

## 2014-11-04 DIAGNOSIS — F209 Schizophrenia, unspecified: Secondary | ICD-10-CM | POA: Insufficient documentation

## 2014-11-04 DIAGNOSIS — Z79899 Other long term (current) drug therapy: Secondary | ICD-10-CM | POA: Insufficient documentation

## 2014-11-04 DIAGNOSIS — Z87891 Personal history of nicotine dependence: Secondary | ICD-10-CM | POA: Diagnosis not present

## 2014-11-04 DIAGNOSIS — M1990 Unspecified osteoarthritis, unspecified site: Secondary | ICD-10-CM | POA: Insufficient documentation

## 2014-11-04 DIAGNOSIS — F410 Panic disorder [episodic paroxysmal anxiety] without agoraphobia: Secondary | ICD-10-CM | POA: Insufficient documentation

## 2014-11-04 DIAGNOSIS — D649 Anemia, unspecified: Secondary | ICD-10-CM | POA: Insufficient documentation

## 2014-11-04 DIAGNOSIS — Z8669 Personal history of other diseases of the nervous system and sense organs: Secondary | ICD-10-CM | POA: Diagnosis not present

## 2014-11-04 DIAGNOSIS — R0602 Shortness of breath: Secondary | ICD-10-CM | POA: Diagnosis not present

## 2014-11-04 DIAGNOSIS — Z791 Long term (current) use of non-steroidal anti-inflammatories (NSAID): Secondary | ICD-10-CM | POA: Diagnosis not present

## 2014-11-04 DIAGNOSIS — R7989 Other specified abnormal findings of blood chemistry: Secondary | ICD-10-CM | POA: Diagnosis present

## 2014-11-04 LAB — CBC WITH DIFFERENTIAL/PLATELET
Basophils Absolute: 0 10*3/uL (ref 0.0–0.1)
Basophils Relative: 0 % (ref 0–1)
EOS PCT: 1 % (ref 0–5)
Eosinophils Absolute: 0.1 10*3/uL (ref 0.0–0.7)
HCT: 37.1 % (ref 36.0–46.0)
Hemoglobin: 11.8 g/dL — ABNORMAL LOW (ref 12.0–15.0)
LYMPHS ABS: 2.2 10*3/uL (ref 0.7–4.0)
Lymphocytes Relative: 29 % (ref 12–46)
MCH: 29.9 pg (ref 26.0–34.0)
MCHC: 31.8 g/dL (ref 30.0–36.0)
MCV: 94.2 fL (ref 78.0–100.0)
Monocytes Absolute: 0.5 10*3/uL (ref 0.1–1.0)
Monocytes Relative: 6 % (ref 3–12)
NEUTROS PCT: 64 % (ref 43–77)
Neutro Abs: 4.8 10*3/uL (ref 1.7–7.7)
Platelets: 311 10*3/uL (ref 150–400)
RBC: 3.94 MIL/uL (ref 3.87–5.11)
RDW: 13.6 % (ref 11.5–15.5)
WBC: 7.5 10*3/uL (ref 4.0–10.5)

## 2014-11-04 LAB — BASIC METABOLIC PANEL
ANION GAP: 8 (ref 5–15)
BUN: 12 mg/dL (ref 6–20)
CHLORIDE: 106 mmol/L (ref 101–111)
CO2: 26 mmol/L (ref 22–32)
Calcium: 8.9 mg/dL (ref 8.9–10.3)
Creatinine, Ser: 0.92 mg/dL (ref 0.44–1.00)
GFR calc Af Amer: >60 mL/min (ref >60)
GFR calc non Af Amer: >60 mL/min (ref >60)
GLUCOSE: 133 mg/dL — AB (ref 65–99)
POTASSIUM: 3.8 mmol/L (ref 3.5–5.1)
Sodium: 140 mmol/L (ref 135–145)

## 2014-11-04 NOTE — ED Notes (Addendum)
Pt reports donates plasma and was told that her hemoglobin is low and hasn't been able to donate lately. Reports she has been yawning a lot which is a sign to her that her hemoglobin is low. Oxygen is 97% RA. Pt reports she has been feeling this way for years and wants someone to pay attention to it.

## 2014-11-04 NOTE — ED Notes (Addendum)
Dc instructions given to pt. All questions answered. NAD. 

## 2014-11-04 NOTE — Discharge Instructions (Signed)
Today's labs show you have very mild anemia.  Please continue taking your iron tablets.  Your chest x-ray is normal, your heart size is normal.  You do not have congestive heart failure to explain your symptoms

## 2014-11-04 NOTE — ED Provider Notes (Signed)
CSN: [id number]     Arrival date & time 11/04/14  1457 History  This chart was scribed for Earley Favor, NP working with Melene Plan, DO by Placido Sou, ED Scribe. This patient was seen in room TR07C/TR07C and the patient's care was started at 4:01 PM.    Chief Complaint  Patient presents with  . Abnormal Lab   The history is provided by the patient. No language interpreter was used.    HPI Comments: Rachel Alvarado is a 49 y.o. female, with a hx of anemia, HTN, schizophrenia, and obesity, who presents to the Emergency Department complaining of chronic, constant, mild, SOB with onset s/p childbirth when she was 49 years old. She describes the feeling as not getting "enough air to her brain" and further notes that she has seen providers for this in the past. Pt notes associated, mild, intermittent, lightheadedness and nausea which is not currently present. Pt also reports that she typically donates plasma and was told that she could no longer donate due both low hemoglobin and iron levels. She notes having an appointment with her PCP for her current complaints. She denies any other associated symptoms.   Past Medical History  Diagnosis Date  . Arthritis   . Chronic back pain   . Depression   . Anxiety   . Panic attacks   . Schizophrenia   . Sleep apnea   . COPD (chronic obstructive pulmonary disease)   . Hypertension    Past Surgical History  Procedure Laterality Date  . Cesarean section    . Partial hysterectomy    . Cholecystectomy     Family History  Problem Relation Age of Onset  . Alcohol abuse Father   . Heart disease Father   . Diabetes Mellitus II Father   . Diabetes Father   . Diabetes Mellitus II Brother   . Stroke Sister   . Osteoporosis Mother   . Heart disease Mother    Social History  Substance Use Topics  . Smoking status: Former Smoker -- 0.02 packs/day    Types: Cigarettes    Quit date: 02/03/2012  . Smokeless tobacco: Never Used  . Alcohol Use: No   OB  History    No data available     Review of Systems  Respiratory: Positive for shortness of breath.   Psychiatric/Behavioral: Positive for confusion and agitation.  All other systems reviewed and are negative.  Allergies  Sulfa antibiotics  Home Medications   Prior to Admission medications   Medication Sig Start Date End Date Taking? Authorizing Provider  cyclobenzaprine (FLEXERIL) 10 MG tablet Take 1 tablet (10 mg total) by mouth 2 (two) times daily as needed for muscle spasms. 08/14/14   Ladona Mow, PA-C  dicyclomine (BENTYL) 20 MG tablet Take 1 tablet (20 mg total) by mouth every 6 (six) hours as needed for spasms (for abdominal cramping). 10/21/14   Marisa Severin, MD  docusate sodium (COLACE) 100 MG capsule Take 1 capsule (100 mg total) by mouth every 12 (twelve) hours. 10/21/14   Marisa Severin, MD  ibuprofen (ADVIL,MOTRIN) 800 MG tablet Take 1 tablet (800 mg total) by mouth 3 (three) times daily. 08/14/14   Ladona Mow, PA-C  polyethylene glycol (MIRALAX / GLYCOLAX) packet Take one capful in 8 oz of fluid of your choice twice a day until having soft stools, then back down to once a day.  If not having soft stools with twice a day, increased to three times a day 10/21/14  Marisa Severin, MD  PRESCRIPTION MEDICATION Blood pressure medication, name unknown. Pharmacy is closed currently opens monday    Historical Provider, MD  QUEtiapine (SEROQUEL) 400 MG tablet Take 400 mg by mouth at bedtime.    Historical Provider, MD  sertraline (ZOLOFT) 50 MG tablet Take 50 mg by mouth daily.    Historical Provider, MD   BP 131/84 mmHg  Pulse 90  Temp(Src) 98 F (36.7 C) (Oral)  Resp 18  SpO2 98% Physical Exam  Constitutional: She is oriented to person, place, and time. She appears well-developed and well-nourished. No distress.  HENT:  Head: Normocephalic and atraumatic.  Mouth/Throat: Oropharynx is clear and moist.  Eyes: Conjunctivae and EOM are normal. Pupils are equal, round, and reactive to light.  Neck:  Normal range of motion. Neck supple. No tracheal deviation present.  Cardiovascular: Normal rate.   Pulmonary/Chest: Effort normal.  Abdominal: Soft. There is no tenderness.  Musculoskeletal: Normal range of motion.  Neurological: She is alert and oriented to person, place, and time.  Skin: Skin is warm and dry.  Psychiatric: She has a normal mood and affect. Her behavior is normal.  Nursing note and vitals reviewed.  ED Course  Procedures  DIAGNOSTIC STUDIES: Oxygen Saturation is 98% on RA, normal by my interpretation.    COORDINATION OF CARE: 4:09 PM Discussed treatment plan with pt at bedside and pt agreed to plan.  Labs Review Labs Reviewed  CBC WITH DIFFERENTIAL/PLATELET  BASIC METABOLIC PANEL    Imaging Review No results found. I have personally reviewed and evaluated these images and lab results as part of my medical decision-making.   EKG Interpretation None     Vision/reviewed.  She has very mild anemia with a hemoglobin of 11.8, but a normal hematocrit.  Chest x-ray has been reviewed.  She does not have any sign of heart failure or heart enlargement to explain her 35 years of symptoms, he'll be referred back to her primary care for MDM   Final diagnoses:  None      I personally performed the services described in this documentation, which was scribed in my presence. The recorded information has been reviewed and is accurate.   Earley Favor, NP 11/04/14 1815  Melene Plan, DO 11/05/14 1248

## 2014-11-05 ENCOUNTER — Other Ambulatory Visit: Payer: Self-pay | Admitting: Internal Medicine

## 2014-11-05 DIAGNOSIS — E2839 Other primary ovarian failure: Secondary | ICD-10-CM

## 2014-11-12 DIAGNOSIS — I10 Essential (primary) hypertension: Secondary | ICD-10-CM | POA: Diagnosis not present

## 2014-11-12 DIAGNOSIS — K5909 Other constipation: Secondary | ICD-10-CM | POA: Diagnosis not present

## 2014-11-12 DIAGNOSIS — G894 Chronic pain syndrome: Secondary | ICD-10-CM | POA: Diagnosis not present

## 2014-11-12 DIAGNOSIS — R32 Unspecified urinary incontinence: Secondary | ICD-10-CM | POA: Diagnosis not present

## 2014-12-02 DIAGNOSIS — M129 Arthropathy, unspecified: Secondary | ICD-10-CM | POA: Diagnosis not present

## 2014-12-02 DIAGNOSIS — Z113 Encounter for screening for infections with a predominantly sexual mode of transmission: Secondary | ICD-10-CM | POA: Diagnosis not present

## 2014-12-02 DIAGNOSIS — M545 Low back pain: Secondary | ICD-10-CM | POA: Diagnosis not present

## 2014-12-02 DIAGNOSIS — D649 Anemia, unspecified: Secondary | ICD-10-CM | POA: Diagnosis not present

## 2014-12-16 DIAGNOSIS — F17210 Nicotine dependence, cigarettes, uncomplicated: Secondary | ICD-10-CM | POA: Diagnosis not present

## 2014-12-16 DIAGNOSIS — M129 Arthropathy, unspecified: Secondary | ICD-10-CM | POA: Diagnosis not present

## 2014-12-16 DIAGNOSIS — M545 Low back pain: Secondary | ICD-10-CM | POA: Diagnosis not present

## 2015-01-12 DIAGNOSIS — E559 Vitamin D deficiency, unspecified: Secondary | ICD-10-CM | POA: Diagnosis not present

## 2015-01-12 DIAGNOSIS — R5383 Other fatigue: Secondary | ICD-10-CM | POA: Diagnosis not present

## 2015-01-12 DIAGNOSIS — Z1389 Encounter for screening for other disorder: Secondary | ICD-10-CM | POA: Diagnosis not present

## 2015-01-12 DIAGNOSIS — I10 Essential (primary) hypertension: Secondary | ICD-10-CM | POA: Diagnosis not present

## 2015-01-12 DIAGNOSIS — Z79899 Other long term (current) drug therapy: Secondary | ICD-10-CM | POA: Diagnosis not present

## 2015-01-12 DIAGNOSIS — Z23 Encounter for immunization: Secondary | ICD-10-CM | POA: Diagnosis not present

## 2015-01-12 DIAGNOSIS — Z0000 Encounter for general adult medical examination without abnormal findings: Secondary | ICD-10-CM | POA: Diagnosis not present

## 2015-01-12 DIAGNOSIS — R0602 Shortness of breath: Secondary | ICD-10-CM | POA: Diagnosis not present

## 2015-01-12 DIAGNOSIS — M5441 Lumbago with sciatica, right side: Secondary | ICD-10-CM | POA: Diagnosis not present

## 2015-01-20 ENCOUNTER — Ambulatory Visit (HOSPITAL_BASED_OUTPATIENT_CLINIC_OR_DEPARTMENT_OTHER): Payer: Medicare Other | Attending: Anesthesiology

## 2015-01-22 DIAGNOSIS — E559 Vitamin D deficiency, unspecified: Secondary | ICD-10-CM | POA: Diagnosis not present

## 2015-01-22 DIAGNOSIS — Z79899 Other long term (current) drug therapy: Secondary | ICD-10-CM | POA: Diagnosis not present

## 2015-01-22 DIAGNOSIS — Z760 Encounter for issue of repeat prescription: Secondary | ICD-10-CM | POA: Diagnosis not present

## 2015-01-29 DIAGNOSIS — J302 Other seasonal allergic rhinitis: Secondary | ICD-10-CM | POA: Diagnosis not present

## 2015-01-29 DIAGNOSIS — I10 Essential (primary) hypertension: Secondary | ICD-10-CM | POA: Diagnosis not present

## 2015-01-29 DIAGNOSIS — G894 Chronic pain syndrome: Secondary | ICD-10-CM | POA: Diagnosis not present

## 2015-01-29 DIAGNOSIS — Z6841 Body Mass Index (BMI) 40.0 and over, adult: Secondary | ICD-10-CM | POA: Diagnosis not present

## 2015-01-29 DIAGNOSIS — F17200 Nicotine dependence, unspecified, uncomplicated: Secondary | ICD-10-CM | POA: Diagnosis not present

## 2015-02-20 DIAGNOSIS — E559 Vitamin D deficiency, unspecified: Secondary | ICD-10-CM | POA: Diagnosis not present

## 2015-02-20 DIAGNOSIS — J449 Chronic obstructive pulmonary disease, unspecified: Secondary | ICD-10-CM | POA: Diagnosis not present

## 2015-02-20 DIAGNOSIS — Z79899 Other long term (current) drug therapy: Secondary | ICD-10-CM | POA: Diagnosis not present

## 2015-02-20 DIAGNOSIS — I10 Essential (primary) hypertension: Secondary | ICD-10-CM | POA: Diagnosis not present

## 2015-03-24 DIAGNOSIS — E559 Vitamin D deficiency, unspecified: Secondary | ICD-10-CM | POA: Diagnosis not present

## 2015-03-24 DIAGNOSIS — Z79899 Other long term (current) drug therapy: Secondary | ICD-10-CM | POA: Diagnosis not present

## 2015-03-24 DIAGNOSIS — Z760 Encounter for issue of repeat prescription: Secondary | ICD-10-CM | POA: Diagnosis not present

## 2015-03-26 ENCOUNTER — Encounter (HOSPITAL_COMMUNITY): Payer: Self-pay | Admitting: *Deleted

## 2015-03-26 ENCOUNTER — Emergency Department (HOSPITAL_COMMUNITY)
Admission: EM | Admit: 2015-03-26 | Discharge: 2015-03-26 | Disposition: A | Discharge status: Home / Self Care | Payer: Commercial Managed Care - HMO | Attending: Emergency Medicine | Admitting: Emergency Medicine

## 2015-03-26 DIAGNOSIS — F419 Anxiety disorder, unspecified: Secondary | ICD-10-CM | POA: Diagnosis not present

## 2015-03-26 DIAGNOSIS — G8929 Other chronic pain: Secondary | ICD-10-CM | POA: Insufficient documentation

## 2015-03-26 DIAGNOSIS — M1990 Unspecified osteoarthritis, unspecified site: Secondary | ICD-10-CM | POA: Insufficient documentation

## 2015-03-26 DIAGNOSIS — I10 Essential (primary) hypertension: Secondary | ICD-10-CM | POA: Diagnosis not present

## 2015-03-26 DIAGNOSIS — Z79899 Other long term (current) drug therapy: Secondary | ICD-10-CM | POA: Diagnosis not present

## 2015-03-26 DIAGNOSIS — F209 Schizophrenia, unspecified: Secondary | ICD-10-CM | POA: Insufficient documentation

## 2015-03-26 DIAGNOSIS — Z8669 Personal history of other diseases of the nervous system and sense organs: Secondary | ICD-10-CM | POA: Diagnosis not present

## 2015-03-26 DIAGNOSIS — J449 Chronic obstructive pulmonary disease, unspecified: Secondary | ICD-10-CM | POA: Diagnosis not present

## 2015-03-26 DIAGNOSIS — Z87891 Personal history of nicotine dependence: Secondary | ICD-10-CM | POA: Diagnosis not present

## 2015-03-26 DIAGNOSIS — F329 Major depressive disorder, single episode, unspecified: Secondary | ICD-10-CM | POA: Diagnosis not present

## 2015-03-26 DIAGNOSIS — R51 Headache: Secondary | ICD-10-CM | POA: Diagnosis not present

## 2015-03-26 DIAGNOSIS — R519 Headache, unspecified: Secondary | ICD-10-CM

## 2015-03-26 DIAGNOSIS — Z862 Personal history of diseases of the blood and blood-forming organs and certain disorders involving the immune mechanism: Secondary | ICD-10-CM | POA: Insufficient documentation

## 2015-03-26 HISTORY — DX: Anemia, unspecified: D64.9

## 2015-03-26 LAB — BASIC METABOLIC PANEL
Anion gap: 9 (ref 5–15)
BUN: 8 mg/dL (ref 6–20)
CALCIUM: 9.3 mg/dL (ref 8.9–10.3)
CO2: 28 mmol/L (ref 22–32)
CREATININE: 0.84 mg/dL (ref 0.44–1.00)
Chloride: 104 mmol/L (ref 101–111)
GFR calc Af Amer: >60 mL/min (ref >60)
GFR calc non Af Amer: >60 mL/min (ref >60)
Glucose, Bld: 91 mg/dL (ref 65–99)
Potassium: 3.7 mmol/L (ref 3.5–5.1)
SODIUM: 141 mmol/L (ref 135–145)

## 2015-03-26 LAB — CBC
HCT: 36.4 % (ref 36.0–46.0)
Hemoglobin: 11.5 g/dL — ABNORMAL LOW (ref 12.0–15.0)
MCH: 30.1 pg (ref 26.0–34.0)
MCHC: 31.6 g/dL (ref 30.0–36.0)
MCV: 95.3 fL (ref 78.0–100.0)
PLATELETS: 332 10*3/uL (ref 150–400)
RBC: 3.82 MIL/uL — ABNORMAL LOW (ref 3.87–5.11)
RDW: 13.8 % (ref 11.5–15.5)
WBC: 5.6 10*3/uL (ref 4.0–10.5)

## 2015-03-26 LAB — CBG MONITORING, ED: GLUCOSE-CAPILLARY: 89 mg/dL (ref 65–99)

## 2015-03-26 MED ORDER — METOCLOPRAMIDE HCL 5 MG/ML IJ SOLN
10.0000 mg | Freq: Once | INTRAMUSCULAR | Status: AC
  Administered 2015-03-26: 10 mg via INTRAVENOUS
  Filled 2015-03-26: qty 2

## 2015-03-26 MED ORDER — DIPHENHYDRAMINE HCL 50 MG/ML IJ SOLN
25.0000 mg | Freq: Once | INTRAMUSCULAR | Status: AC
  Administered 2015-03-26: 25 mg via INTRAVENOUS
  Filled 2015-03-26: qty 1

## 2015-03-26 MED ORDER — SODIUM CHLORIDE 0.9 % IV BOLUS (SEPSIS)
1000.0000 mL | Freq: Once | INTRAVENOUS | Status: AC
  Administered 2015-03-26: 1000 mL via INTRAVENOUS

## 2015-03-26 NOTE — ED Notes (Signed)
Pt stable, ambulatory, states understanding of discharge instructions 

## 2015-03-26 NOTE — ED Notes (Addendum)
Pt very poor historian.  States L posterior headache, off and on, for a little less than 6 months.  Pt came in today b/c she has been feeling dizzy, off and on (not sure when those s/s began).  Denies nausea, blurred vision.

## 2015-03-26 NOTE — Discharge Instructions (Signed)
You were seen in the emergency room today for evaluation of headache. Your headache resolved with medication.  Your blood pressure was also noted to be high in the emergency room. There does not appear to be evidence of kidney or other organ damage. Please keep taking your blood pressure medicine as prescribed and follow up with your primary care provider for ongoing management of your blood pressure.

## 2015-03-26 NOTE — ED Provider Notes (Signed)
CSN: [id number]     Arrival date & time 03/26/15  1712 History   First MD Initiated Contact with Patient 03/26/15 2018     Chief Complaint  Patient presents with  . Headache  . Dizziness     HPI   Rachel Alvarado is an 50 y.o. female with history of HTN, COPD, chronic back pain, depression, anxiety, and schizophrenia who presents to the ED for evaluation of headache. She reports to me that she has had an intermittent dull, throbbing headache around the left parieto/occiptal area on and off for the past few weeks. Per nursing note, she has had this headache on and off for the past six months. Pt states that the headache comes and goes and is dull. Denies sharp or sudden onset. Denies radiation of the pain. Denies associated dizziness, visual disturbance, feeling faint, or weak. Denies fever, chills, neck rigidity. Denies difficulty walking. Denies hearing changes. Denies chest pain or SOB. She states she has not tried anything for the headache.   Past Medical History  Diagnosis Date  . Arthritis   . Chronic back pain   . Depression   . Anxiety   . Panic attacks   . Schizophrenia (HCC)   . Sleep apnea   . COPD (chronic obstructive pulmonary disease) (HCC)   . Hypertension   . Anemia    Past Surgical History  Procedure Laterality Date  . Cesarean section    . Partial hysterectomy    . Cholecystectomy     Family History  Problem Relation Age of Onset  . Alcohol abuse Father   . Heart disease Father   . Diabetes Mellitus II Father   . Diabetes Father   . Diabetes Mellitus II Brother   . Stroke Sister   . Osteoporosis Mother   . Heart disease Mother    Social History  Substance Use Topics  . Smoking status: Former Smoker -- 0.02 packs/day    Types: Cigarettes    Quit date: 02/03/2012  . Smokeless tobacco: Never Used  . Alcohol Use: No   OB History    No data available     Review of Systems  All other systems reviewed and are negative.     Allergies  Sulfa  antibiotics  Home Medications   Prior to Admission medications   Medication Sig Start Date End Date Taking? Authorizing Provider  cyclobenzaprine (FLEXERIL) 10 MG tablet Take 1 tablet (10 mg total) by mouth 2 (two) times daily as needed for muscle spasms. 08/14/14   Ladona Mow, PA-C  dicyclomine (BENTYL) 20 MG tablet Take 1 tablet (20 mg total) by mouth every 6 (six) hours as needed for spasms (for abdominal cramping). 10/21/14   Marisa Severin, MD  docusate sodium (COLACE) 100 MG capsule Take 1 capsule (100 mg total) by mouth every 12 (twelve) hours. 10/21/14   Marisa Severin, MD  ibuprofen (ADVIL,MOTRIN) 800 MG tablet Take 1 tablet (800 mg total) by mouth 3 (three) times daily. 08/14/14   Ladona Mow, PA-C  polyethylene glycol (MIRALAX / GLYCOLAX) packet Take one capful in 8 oz of fluid of your choice twice a day until having soft stools, then back down to once a day.  If not having soft stools with twice a day, increased to three times a day 10/21/14   Marisa Severin, MD  PRESCRIPTION MEDICATION Blood pressure medication, name unknown. Pharmacy is closed currently opens monday    Historical Provider, MD  QUEtiapine (SEROQUEL) 400 MG tablet Take 400 mg by  mouth at bedtime.    Historical Provider, MD  sertraline (ZOLOFT) 50 MG tablet Take 50 mg by mouth daily.    Historical Provider, MD   BP 149/135 mmHg  Pulse 78  Temp(Src) 98 F (36.7 C) (Oral)  Resp 17  Ht 5\' 4"  (1.626 m)  SpO2 99% Physical Exam  Constitutional: She is oriented to person, place, and time. No distress.  HENT:  Right Ear: External ear normal.  Left Ear: External ear normal.  Nose: Nose normal.  Mouth/Throat: Oropharynx is clear and moist. No oropharyngeal exudate.  Eyes: Conjunctivae and EOM are normal. Pupils are equal, round, and reactive to light.  Neck: Normal range of motion. Neck supple.  Cardiovascular: Normal rate, regular rhythm, normal heart sounds and intact distal pulses.   Pulmonary/Chest: Effort normal and breath sounds  normal. No respiratory distress. She exhibits no tenderness.  Abdominal: Soft. Bowel sounds are normal. She exhibits no distension. There is no tenderness.  Musculoskeletal: She exhibits no edema.  Neurological: She is alert and oriented to person, place, and time. She has normal strength. No cranial nerve deficit or sensory deficit. She displays a negative Romberg sign. Coordination and gait normal.  Skin: Skin is warm and dry. She is not diaphoretic.  Psychiatric: She has a normal mood and affect.  Nursing note and vitals reviewed.  Filed Vitals:   03/26/15 2130 03/26/15 2145 03/26/15 2200 03/26/15 2215  BP: 139/86 159/94 155/85 154/85  Pulse: 81 72 86 75  Temp:      TempSrc:      Resp: 16 17 24 15   Height:      SpO2: 100% 100% 99% 100%     ED Course  Procedures (including critical care time) Labs Review Labs Reviewed  CBC - Abnormal; Notable for the following:    RBC 3.82 (*)    Hemoglobin 11.5 (*)    All other components within normal limits  BASIC METABOLIC PANEL  CBG MONITORING, ED    Imaging Review No results found. I have personally reviewed and evaluated these images and lab results as part of my medical decision-making.   EKG Interpretation None      MDM   Final diagnoses:  Acute nonintractable headache, unspecified headache type  Essential hypertension    Pt is an 50 y.o. female with headache that has been present for at least several weeks, if not months. Her neuro exam is completely intact. Her vitals show hypertension at baseline and are otherwise unremarkable. She is afebrile with no signs of meningismus. Her headache resolved completely with 1L NS bolus, reglan, and benadryl. I see no indication for emergent head imaging at this time. Encouraged pt to f/u with PCP for headache as well as better blood pressure control. No e/o end organ damage here. Pt is stable for discharge. ER return precautions given.       Carlene CoriaSerena Y Mcclain Shall, PA-C 03/27/15  0050  Laurence Spatesachel Morgan Little, MD 03/27/15 [phone]

## 2015-04-21 DIAGNOSIS — J029 Acute pharyngitis, unspecified: Secondary | ICD-10-CM | POA: Diagnosis not present

## 2015-04-21 DIAGNOSIS — I10 Essential (primary) hypertension: Secondary | ICD-10-CM | POA: Diagnosis not present

## 2015-04-21 DIAGNOSIS — F1410 Cocaine abuse, uncomplicated: Secondary | ICD-10-CM | POA: Diagnosis not present

## 2015-04-21 DIAGNOSIS — E559 Vitamin D deficiency, unspecified: Secondary | ICD-10-CM | POA: Diagnosis not present

## 2015-04-21 DIAGNOSIS — Z79899 Other long term (current) drug therapy: Secondary | ICD-10-CM | POA: Diagnosis not present

## 2015-05-05 DIAGNOSIS — F1410 Cocaine abuse, uncomplicated: Secondary | ICD-10-CM | POA: Diagnosis not present

## 2015-05-05 DIAGNOSIS — Z79899 Other long term (current) drug therapy: Secondary | ICD-10-CM | POA: Diagnosis not present

## 2015-05-05 DIAGNOSIS — Z760 Encounter for issue of repeat prescription: Secondary | ICD-10-CM | POA: Diagnosis not present

## 2015-05-05 DIAGNOSIS — E559 Vitamin D deficiency, unspecified: Secondary | ICD-10-CM | POA: Diagnosis not present

## 2015-05-12 ENCOUNTER — Encounter (HOSPITAL_COMMUNITY): Payer: Self-pay | Admitting: *Deleted

## 2015-05-12 ENCOUNTER — Emergency Department (HOSPITAL_COMMUNITY)
Admission: EM | Admit: 2015-05-12 | Discharge: 2015-05-12 | Disposition: A | Discharge status: Home / Self Care | Payer: Commercial Managed Care - HMO | Attending: Emergency Medicine | Admitting: Emergency Medicine

## 2015-05-12 DIAGNOSIS — F419 Anxiety disorder, unspecified: Secondary | ICD-10-CM | POA: Diagnosis not present

## 2015-05-12 DIAGNOSIS — F329 Major depressive disorder, single episode, unspecified: Secondary | ICD-10-CM | POA: Insufficient documentation

## 2015-05-12 DIAGNOSIS — D649 Anemia, unspecified: Secondary | ICD-10-CM | POA: Insufficient documentation

## 2015-05-12 DIAGNOSIS — M545 Low back pain: Secondary | ICD-10-CM | POA: Insufficient documentation

## 2015-05-12 DIAGNOSIS — G8929 Other chronic pain: Secondary | ICD-10-CM

## 2015-05-12 DIAGNOSIS — F209 Schizophrenia, unspecified: Secondary | ICD-10-CM | POA: Diagnosis not present

## 2015-05-12 DIAGNOSIS — I10 Essential (primary) hypertension: Secondary | ICD-10-CM | POA: Diagnosis not present

## 2015-05-12 DIAGNOSIS — J449 Chronic obstructive pulmonary disease, unspecified: Secondary | ICD-10-CM | POA: Insufficient documentation

## 2015-05-12 DIAGNOSIS — Z79899 Other long term (current) drug therapy: Secondary | ICD-10-CM | POA: Diagnosis not present

## 2015-05-12 DIAGNOSIS — M1990 Unspecified osteoarthritis, unspecified site: Secondary | ICD-10-CM | POA: Diagnosis not present

## 2015-05-12 DIAGNOSIS — Z87891 Personal history of nicotine dependence: Secondary | ICD-10-CM | POA: Insufficient documentation

## 2015-05-12 NOTE — ED Notes (Signed)
"  Isn't he going to give me a prescription..I'm going to go out here and call the ambulance to take me to [facility]".

## 2015-05-12 NOTE — Discharge Instructions (Signed)
You have been seen today for chronic back pain and narcotic pain medication refill. It is our policy that we do not refill narcotic pain medications. You were offered treatment here in the ED, but you declined. You are always welcome to return for treatment. Follow up with PCP as needed for chronic management of this issue. Return to ED should symptoms worsen.

## 2015-05-12 NOTE — ED Notes (Signed)
PT REFUSED TO SIGN DISCHARGE FORM.

## 2015-05-12 NOTE — ED Notes (Signed)
Pt is here for chronic lower back pain. Pt was picked up at here pain management clinic. Pt was not able to be seen without and appointment and is requesting prescriptions for pain.

## 2015-05-12 NOTE — ED Provider Notes (Signed)
CSN: [id number]     Arrival date & time 05/12/15  1055 History  By signing my name below, I, Rachel Alvarado, attest that this documentation has been prepared under the direction and in the presence of non-physician practitioner, Rachel Rutherford, PA-C. Electronically Signed: Freida Alvarado, Scribe. 05/12/2015. 11:52 AM.    Chief Complaint  Patient presents with  . Back Pain    The history is provided by the patient. No language interpreter was used.    HPI Comments:  Rachel Alvarado is a 50 y.o. female with a history of chronic back pain, who presents to the Emergency Department complaining of increased lower back pain x a few days.  Pt states she needs a refill of her "percocet 5s" until she can get into to see her pain management Alvarado on 06/01/15. No alleviating factors noted. Pt has no other complaints or symptoms at this time. Patient has no new symptoms. No acute fall/injury/trauma.   Past Medical History  Diagnosis Date  . Arthritis   . Chronic back pain   . Depression   . Anxiety   . Panic attacks   . Schizophrenia (HCC)   . Sleep apnea   . COPD (chronic obstructive pulmonary disease) (HCC)   . Hypertension   . Anemia    Past Surgical History  Procedure Laterality Date  . Cesarean section    . Partial hysterectomy    . Cholecystectomy     Family History  Problem Relation Age of Onset  . Alcohol abuse Father   . Heart disease Father   . Diabetes Mellitus II Father   . Diabetes Father   . Diabetes Mellitus II Brother   . Stroke Sister   . Osteoporosis Mother   . Heart disease Mother    Social History  Substance Use Topics  . Smoking status: Former Smoker -- 0.02 packs/day    Types: Cigarettes    Quit date: 02/03/2012  . Smokeless tobacco: Never Used  . Alcohol Use: No   OB History    No data available     Review of Systems  Constitutional: Negative for fever and chills.       + med refill  Genitourinary: Negative for dysuria, urgency, frequency and difficulty  urinating.  Musculoskeletal: Positive for back pain.    Allergies  Sulfa antibiotics  Home Medications   Prior to Admission medications   Medication Sig Start Date End Date Taking? Authorizing Alvarado  cyclobenzaprine (FLEXERIL) 10 MG tablet Take 1 tablet (10 mg total) by mouth 2 (two) times daily as needed for muscle spasms. Patient not taking: Reported on 03/26/2015 08/14/14   Rachel Mow, PA-C  dicyclomine (BENTYL) 20 MG tablet Take 1 tablet (20 mg total) by mouth every 6 (six) hours as needed for spasms (for abdominal cramping). Patient not taking: Reported on 03/26/2015 10/21/14   Rachel Severin, MD  docusate sodium (COLACE) 100 MG capsule Take 1 capsule (100 mg total) by mouth every 12 (twelve) hours. Patient not taking: Reported on 03/26/2015 10/21/14   Rachel Severin, MD  ferrous sulfate 325 (65 FE) MG tablet Take 325 mg by mouth daily with breakfast.    Rachel Provider, MD  ibuprofen (ADVIL,MOTRIN) 800 MG tablet Take 1 tablet (800 mg total) by mouth 3 (three) times daily. Patient not taking: Reported on 03/26/2015 08/14/14   Rachel Mow, PA-C  losartan (COZAAR) 50 MG tablet Take 50 mg by mouth daily. 02/16/15   Rachel Provider, MD  oxyCODONE-acetaminophen (PERCOCET/ROXICET) 5-325 MG tablet Take 1  tablet by mouth every 6 (six) hours as needed. pain 03/24/15   Rachel Provider, MD  polyethylene glycol (MIRALAX / GLYCOLAX) packet Take one capful in 8 oz of fluid of your choice twice a day until having soft stools, then back down to once a day.  If not having soft stools with twice a day, increased to three times a day Patient not taking: Reported on 03/26/2015 10/21/14   Rachel Severin, MD  QUEtiapine (SEROQUEL) 400 MG tablet Take 400 mg by mouth at bedtime.    Rachel Provider, MD   BP 109/79 mmHg  Pulse 87  Temp(Src) 98.5 F (36.9 C) (Oral)  Resp 18  SpO2 99% Physical Exam  Constitutional: She is oriented to person, place, and time. She appears well-developed and well-nourished. No distress.   HENT:  Head: Normocephalic and atraumatic.  Eyes: Conjunctivae are normal.  Cardiovascular: Normal rate and intact distal pulses.   Pulmonary/Chest: Effort normal.  Abdominal: She exhibits no distension.  Musculoskeletal:  TTP left lumbar musculature. Full ROM in all extremities and spine. No paraspinal tenderness.   Neurological: She is alert and oriented to person, place, and time. She has normal reflexes.  No sensory deficits. Strength 5/5 in all extremities. No gait disturbance. Coordination intact.   Skin: Skin is warm and dry.  Psychiatric: She has a normal mood and affect.  Nursing note and vitals reviewed.   ED Course  Procedures    DIAGNOSTIC STUDIES:  Oxygen Saturation is 99% on RA, normal by my interpretation.    COORDINATION OF CARE:  11:47 AM Pt refused tordal/robaxin/percocet in ED today; states she only wants RX for percocet.     MDM   Final diagnoses:  Chronic low back pain    Rachel Alvarado presents with acute on chronic lower back pain.  Patient has a normal neuro exam.  No loss of bowel or bladder control.  No concern for cauda equina.  No fever, night sweats, weight loss, h/o cancer, IVDA, and no recent procedure to back. No urinary symptoms suggestive of UTI. It was explained to the patient that our policy dictates that we do not refill narcotic pain medications. Patient was offered pain management while here in the ED. Patient became upset when she was told that she would not be getting narcotic pain medication prescription. Patient was again offered pain management while she is here, but the patient states, "If you're not going to give me a prescription for my pain pills then forget the whole thing." Return precaution discussed. Patient was also advised that she is welcome back for treatment in the ED at any time. Pt advised to keep pain management appointment. Appears safe for discharge at this time. Follow up as indicated in discharge paperwork.    I  personally performed the services described in this documentation, which was scribed in my presence. The recorded information has been reviewed and is accurate.   Anselm Pancoast, PA-C 05/13/15 0935  Alvira Monday, MD 05/14/15 1241

## 2015-05-18 ENCOUNTER — Encounter (HOSPITAL_COMMUNITY): Payer: Self-pay

## 2015-05-18 ENCOUNTER — Emergency Department (HOSPITAL_COMMUNITY)
Admission: EM | Admit: 2015-05-18 | Discharge: 2015-05-18 | Disposition: A | Discharge status: Other Acute Inpatient Hospital | Payer: Commercial Managed Care - HMO | Attending: Emergency Medicine | Admitting: Emergency Medicine

## 2015-05-18 DIAGNOSIS — R45851 Suicidal ideations: Secondary | ICD-10-CM | POA: Diagnosis not present

## 2015-05-18 DIAGNOSIS — F329 Major depressive disorder, single episode, unspecified: Secondary | ICD-10-CM | POA: Diagnosis not present

## 2015-05-18 DIAGNOSIS — I10 Essential (primary) hypertension: Secondary | ICD-10-CM | POA: Insufficient documentation

## 2015-05-18 DIAGNOSIS — E669 Obesity, unspecified: Secondary | ICD-10-CM | POA: Diagnosis not present

## 2015-05-18 DIAGNOSIS — G8929 Other chronic pain: Secondary | ICD-10-CM | POA: Diagnosis not present

## 2015-05-18 DIAGNOSIS — M1990 Unspecified osteoarthritis, unspecified site: Secondary | ICD-10-CM | POA: Insufficient documentation

## 2015-05-18 DIAGNOSIS — Z862 Personal history of diseases of the blood and blood-forming organs and certain disorders involving the immune mechanism: Secondary | ICD-10-CM | POA: Diagnosis not present

## 2015-05-18 DIAGNOSIS — F1410 Cocaine abuse, uncomplicated: Secondary | ICD-10-CM | POA: Insufficient documentation

## 2015-05-18 DIAGNOSIS — Z79899 Other long term (current) drug therapy: Secondary | ICD-10-CM | POA: Insufficient documentation

## 2015-05-18 DIAGNOSIS — M545 Low back pain: Secondary | ICD-10-CM | POA: Insufficient documentation

## 2015-05-18 DIAGNOSIS — G4700 Insomnia, unspecified: Secondary | ICD-10-CM | POA: Insufficient documentation

## 2015-05-18 DIAGNOSIS — J449 Chronic obstructive pulmonary disease, unspecified: Secondary | ICD-10-CM | POA: Insufficient documentation

## 2015-05-18 DIAGNOSIS — R440 Auditory hallucinations: Secondary | ICD-10-CM | POA: Diagnosis not present

## 2015-05-18 DIAGNOSIS — G479 Sleep disorder, unspecified: Secondary | ICD-10-CM | POA: Diagnosis present

## 2015-05-18 DIAGNOSIS — Z87891 Personal history of nicotine dependence: Secondary | ICD-10-CM | POA: Diagnosis not present

## 2015-05-18 DIAGNOSIS — R443 Hallucinations, unspecified: Secondary | ICD-10-CM

## 2015-05-18 LAB — CBC WITH DIFFERENTIAL/PLATELET
Basophils Absolute: 0 K/uL (ref 0.0–0.1)
Basophils Relative: 0 %
Eosinophils Absolute: 0.1 K/uL (ref 0.0–0.7)
Eosinophils Relative: 1 %
HCT: 35.6 % — ABNORMAL LOW (ref 36.0–46.0)
Hemoglobin: 11.1 g/dL — ABNORMAL LOW (ref 12.0–15.0)
Lymphocytes Relative: 35 %
Lymphs Abs: 2.1 K/uL (ref 0.7–4.0)
MCH: 29.7 pg (ref 26.0–34.0)
MCHC: 31.2 g/dL (ref 30.0–36.0)
MCV: 95.2 fL (ref 78.0–100.0)
Monocytes Absolute: 0.4 K/uL (ref 0.1–1.0)
Monocytes Relative: 7 %
Neutro Abs: 3.4 K/uL (ref 1.7–7.7)
Neutrophils Relative %: 57 %
Platelets: 301 K/uL (ref 150–400)
RBC: 3.74 MIL/uL — ABNORMAL LOW (ref 3.87–5.11)
RDW: 13.7 % (ref 11.5–15.5)
WBC: 6.1 K/uL (ref 4.0–10.5)

## 2015-05-18 LAB — RAPID URINE DRUG SCREEN, HOSP PERFORMED
AMPHETAMINES: NOT DETECTED
Barbiturates: NOT DETECTED
Benzodiazepines: NOT DETECTED
COCAINE: POSITIVE — AB
OPIATES: NOT DETECTED
TETRAHYDROCANNABINOL: NOT DETECTED

## 2015-05-18 LAB — BASIC METABOLIC PANEL WITH GFR
Anion gap: 15 (ref 5–15)
BUN: 10 mg/dL (ref 6–20)
CO2: 24 mmol/L (ref 22–32)
Calcium: 8.7 mg/dL — ABNORMAL LOW (ref 8.9–10.3)
Chloride: 104 mmol/L (ref 101–111)
Creatinine, Ser: 0.81 mg/dL (ref 0.44–1.00)
GFR calc Af Amer: >60 mL/min
GFR calc non Af Amer: >60 mL/min
Glucose, Bld: 139 mg/dL — ABNORMAL HIGH (ref 65–99)
Potassium: 3.3 mmol/L — ABNORMAL LOW (ref 3.5–5.1)
Sodium: 143 mmol/L (ref 135–145)

## 2015-05-18 LAB — ETHANOL: Alcohol, Ethyl (B): <5 mg/dL (ref <5)

## 2015-05-18 MED ORDER — LORAZEPAM 2 MG/ML IJ SOLN: 1.0000 mg | Freq: Once | INTRAMUSCULAR | Status: AC

## 2015-05-18 MED ORDER — LORAZEPAM 1 MG PO TABS
1.0000 mg | ORAL_TABLET | Freq: Once | ORAL | Status: AC
  Administered 2015-05-18: 1 mg via ORAL
  Filled 2015-05-18: qty 1

## 2015-05-18 MED ORDER — QUETIAPINE FUMARATE 200 MG PO TABS
400.0000 mg | ORAL_TABLET | Freq: Every day | ORAL | Status: DC
  Administered 2015-05-18: 400 mg via ORAL
  Filled 2015-05-18 (×2): qty 2

## 2015-05-18 NOTE — ED Notes (Signed)
Patient was given a cup of water. A regular diet ordered for lunch.

## 2015-05-18 NOTE — Progress Notes (Signed)
Rachel Alvarado at [facility] states Dr Hardie Pulley has accepted pt for admission.  Spoke with [facility]- transfer being arranged by Cammy Copa, MSW, LCSW Clinical Social Work, Disposition  05/18/2015 [phone]

## 2015-05-18 NOTE — ED Provider Notes (Signed)
CSN: [id number]     Arrival date & time 05/18/15  0409 History   First MD Initiated Contact with Patient 05/18/15 0421     Chief Complaint  Patient presents with  . Insomnia  . Medication Refill     (Consider location/radiation/quality/duration/timing/severity/associated sxs/prior Treatment) HPI  This is a 50 year old female with history of schizophrenia, anxiety, chronic back pain who presents with chief complaint of insomnia. Patient reports that she has not slept in 4 days. She normally takes Seroquel but has been out. She reports chronic lower back pain. No changes. Denies any weakness, numbness, tingling. Initially patient denied suicidal ideation or hallucinations. However, she later told nursing that she "just don't want to live like this if I can't sleep." Also reports hallucinations and hearing voices. Denies any HI. Reports that she does not feel safe at home. States that she does not have a mental health provider at this time.  Past Medical History  Diagnosis Date  . Arthritis   . Chronic back pain   . Depression   . Anxiety   . Panic attacks   . Schizophrenia (HCC)   . Sleep apnea   . COPD (chronic obstructive pulmonary disease) (HCC)   . Hypertension   . Anemia    Past Surgical History  Procedure Laterality Date  . Cesarean section    . Partial hysterectomy    . Cholecystectomy     Family History  Problem Relation Age of Onset  . Alcohol abuse Father   . Heart disease Father   . Diabetes Mellitus II Father   . Diabetes Father   . Diabetes Mellitus II Brother   . Stroke Sister   . Osteoporosis Mother   . Heart disease Mother    Social History  Substance Use Topics  . Smoking status: Former Smoker -- 0.02 packs/day    Types: Cigarettes    Quit date: 02/03/2012  . Smokeless tobacco: Never Used  . Alcohol Use: No   OB History    No data available     Review of Systems  Constitutional: Negative for fever.  Genitourinary: Negative for difficulty  urinating.  Musculoskeletal: Positive for back pain.  Neurological: Negative for weakness and numbness.  Psychiatric/Behavioral: Positive for suicidal ideas, hallucinations and sleep disturbance.  All other systems reviewed and are negative.     Allergies  Sulfa antibiotics  Home Medications   Prior to Admission medications   Medication Sig Start Date End Date Taking? Authorizing Provider  losartan (COZAAR) 50 MG tablet Take 50 mg by mouth daily. 02/16/15  Yes Historical Provider, MD  oxyCODONE-acetaminophen (PERCOCET/ROXICET) 5-325 MG tablet Take 1 tablet by mouth every 6 (six) hours as needed. pain 03/24/15  Yes Historical Provider, MD  QUEtiapine (SEROQUEL) 400 MG tablet Take 400 mg by mouth at bedtime.   Yes Historical Provider, MD  cyclobenzaprine (FLEXERIL) 10 MG tablet Take 1 tablet (10 mg total) by mouth 2 (two) times daily as needed for muscle spasms. Patient not taking: Reported on 03/26/2015 08/14/14   Ladona Mow, PA-C  dicyclomine (BENTYL) 20 MG tablet Take 1 tablet (20 mg total) by mouth every 6 (six) hours as needed for spasms (for abdominal cramping). Patient not taking: Reported on 03/26/2015 10/21/14   Marisa Severin, MD  docusate sodium (COLACE) 100 MG capsule Take 1 capsule (100 mg total) by mouth every 12 (twelve) hours. Patient not taking: Reported on 03/26/2015 10/21/14   Marisa Severin, MD  ibuprofen (ADVIL,MOTRIN) 800 MG tablet Take 1 tablet (800 mg  total) by mouth 3 (three) times daily. Patient not taking: Reported on 03/26/2015 08/14/14   Ladona Mow, PA-C  polyethylene glycol [facility] Hal) packet Take one capful in 8 oz of fluid of your choice twice a day until having soft stools, then back down to once a day.  If not having soft stools with twice a day, increased to three times a day Patient not taking: Reported on 03/26/2015 10/21/14   Marisa Severin, MD   BP 160/148 mmHg  Pulse 93  Ht  (1.626 m)  Wt 276 lb (125.193 kg)  BMI 47.35 kg/m2  SpO2 100% Physical Exam   Constitutional: She is oriented to person, place, and time.  Obese  HENT:  Head: Normocephalic and atraumatic.  Cardiovascular: Normal rate, regular rhythm and normal heart sounds.   No murmur heard. Pulmonary/Chest: Effort normal and breath sounds normal. No respiratory distress. She has no wheezes.  Abdominal: Soft. Bowel sounds are normal. There is no tenderness. There is no rebound.  Musculoskeletal: She exhibits no edema.  Neurological: She is alert and oriented to person, place, and time.  Skin: Skin is warm and dry.  Psychiatric:  Very tangential in speech and thought, anxious appearing  Nursing note and vitals reviewed.   ED Course  Procedures (including critical care time) Labs Review Labs Reviewed  CBC WITH DIFFERENTIAL/PLATELET - Abnormal; Notable for the following:    RBC 3.74 (*)    Hemoglobin 11.1 (*)    HCT 35.6 (*)    All other components within normal limits  BASIC METABOLIC PANEL - Abnormal; Notable for the following:    Potassium 3.3 (*)    Glucose, Bld 139 (*)    Calcium 8.7 (*)    All other components within normal limits  ETHANOL  URINE RAPID DRUG SCREEN, HOSP PERFORMED    Imaging Review No results found. I have personally reviewed and evaluated these images and lab results as part of my medical decision-making.   EKG Interpretation None      MDM   Final diagnoses:  Insomnia  Hallucinations    Patient presents with a chief complaint of insomnia. Later expressed suicidal ideation and hallucinations. She is nontoxic on exam. She was given 400 mg of Seroquel which is her home dose. We'll have TTS evaluate.  TTS recommending inpatient admission. Waiting for bed.  Shon Baton, MD 05/18/15 [phone]

## 2015-05-18 NOTE — ED Notes (Signed)
Pt comes from home via [facility], c/o insomnia and wants her seroquel, also a c/o headache.
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
## 2015-05-18 NOTE — ED Notes (Signed)
Patient belongings was placed on 2nd rack in soiled utility room. Pt. Has a 1 black basket, 1 purple gym bag, and 1 belongings bag.

## 2015-05-18 NOTE — ED Notes (Signed)
Staffing called for sitter.   

## 2015-05-18 NOTE — ED Provider Notes (Signed)
Patient was accepted to psychiatric facility by Dr Wendall Stade. Pt is medically stable, no acute distress on exam and able to be transferred safely to facility capable of providing appropriate care.   Lyndal Pulley, MD 05/18/15 1059
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
## 2015-05-18 NOTE — ED Notes (Signed)
Sitter arrived at this time 

## 2015-05-18 NOTE — ED Notes (Signed)
Pt. Asked sitter to use the restroom and sitter directed pt. Where restroom was located. Pt. Started screaming stating she could not go out in the hallway with pants that do not fit. RN offered different pants, but pt. Continued screaming and telling sitter and RN to 'go to hell'. EDP notified and ordered ativan. Pt. Agreed to take the pill. Pt. Still agitated over not having syrup for her pancakes. Cafe to bring syrup up. Pt. Very emotionally labile. Pt. Being pleasant and cooperative at one moment and screaming at the next. [employer] and Security outside door at this time.

## 2015-05-18 NOTE — ED Notes (Signed)
PT will move to Pod C 29

## 2015-06-26 ENCOUNTER — Emergency Department (HOSPITAL_COMMUNITY)
Admission: EM | Admit: 2015-06-26 | Discharge: 2015-06-26 | Disposition: A | Discharge status: Home / Self Care | Payer: Medicare Other | Attending: Emergency Medicine | Admitting: Emergency Medicine

## 2015-06-26 ENCOUNTER — Encounter (HOSPITAL_COMMUNITY): Payer: Self-pay | Admitting: Emergency Medicine

## 2015-06-26 ENCOUNTER — Emergency Department (HOSPITAL_COMMUNITY): Payer: Medicare Other

## 2015-06-26 DIAGNOSIS — J449 Chronic obstructive pulmonary disease, unspecified: Secondary | ICD-10-CM | POA: Diagnosis not present

## 2015-06-26 DIAGNOSIS — G8929 Other chronic pain: Secondary | ICD-10-CM | POA: Diagnosis not present

## 2015-06-26 DIAGNOSIS — Z87891 Personal history of nicotine dependence: Secondary | ICD-10-CM | POA: Insufficient documentation

## 2015-06-26 DIAGNOSIS — F419 Anxiety disorder, unspecified: Secondary | ICD-10-CM | POA: Diagnosis not present

## 2015-06-26 DIAGNOSIS — F329 Major depressive disorder, single episode, unspecified: Secondary | ICD-10-CM | POA: Insufficient documentation

## 2015-06-26 DIAGNOSIS — Z79899 Other long term (current) drug therapy: Secondary | ICD-10-CM | POA: Diagnosis not present

## 2015-06-26 DIAGNOSIS — F1410 Cocaine abuse, uncomplicated: Secondary | ICD-10-CM | POA: Diagnosis not present

## 2015-06-26 DIAGNOSIS — I10 Essential (primary) hypertension: Secondary | ICD-10-CM | POA: Diagnosis not present

## 2015-06-26 DIAGNOSIS — R079 Chest pain, unspecified: Secondary | ICD-10-CM | POA: Diagnosis not present

## 2015-06-26 DIAGNOSIS — Z862 Personal history of diseases of the blood and blood-forming organs and certain disorders involving the immune mechanism: Secondary | ICD-10-CM | POA: Insufficient documentation

## 2015-06-26 DIAGNOSIS — M1990 Unspecified osteoarthritis, unspecified site: Secondary | ICD-10-CM | POA: Insufficient documentation

## 2015-06-26 DIAGNOSIS — Z0000 Encounter for general adult medical examination without abnormal findings: Secondary | ICD-10-CM

## 2015-06-26 DIAGNOSIS — F209 Schizophrenia, unspecified: Secondary | ICD-10-CM | POA: Diagnosis not present

## 2015-06-26 DIAGNOSIS — Z1231 Encounter for screening mammogram for malignant neoplasm of breast: Secondary | ICD-10-CM

## 2015-06-26 LAB — BASIC METABOLIC PANEL
Anion gap: 11 (ref 5–15)
BUN: 9 mg/dL (ref 6–20)
CO2: 23 mmol/L (ref 22–32)
Calcium: 8.8 mg/dL — ABNORMAL LOW (ref 8.9–10.3)
Chloride: 108 mmol/L (ref 101–111)
Creatinine, Ser: 0.86 mg/dL (ref 0.44–1.00)
GFR calc Af Amer: >60 mL/min (ref >60)
GFR calc non Af Amer: >60 mL/min (ref >60)
Glucose, Bld: 100 mg/dL — ABNORMAL HIGH (ref 65–99)
Potassium: 3.4 mmol/L — ABNORMAL LOW (ref 3.5–5.1)
Sodium: 142 mmol/L (ref 135–145)

## 2015-06-26 LAB — I-STAT TROPONIN, ED
Troponin i, poc: 0 ng/mL (ref 0.00–0.08)
Troponin i, poc: 0 ng/mL (ref 0.00–0.08)

## 2015-06-26 LAB — URINALYSIS, ROUTINE W REFLEX MICROSCOPIC
Bilirubin Urine: NEGATIVE
Glucose, UA: NEGATIVE mg/dL
Hgb urine dipstick: NEGATIVE
Ketones, ur: NEGATIVE mg/dL
Leukocytes, UA: NEGATIVE
Nitrite: NEGATIVE
Protein, ur: NEGATIVE mg/dL
Specific Gravity, Urine: 1.026 (ref 1.005–1.030)
pH: 6 (ref 5.0–8.0)

## 2015-06-26 LAB — CBC
HCT: 35.8 % — ABNORMAL LOW (ref 36.0–46.0)
Hemoglobin: 11.8 g/dL — ABNORMAL LOW (ref 12.0–15.0)
MCH: 31.3 pg (ref 26.0–34.0)
MCHC: 33 g/dL (ref 30.0–36.0)
MCV: 95 fL (ref 78.0–100.0)
Platelets: 260 10*3/uL (ref 150–400)
RBC: 3.77 MIL/uL — ABNORMAL LOW (ref 3.87–5.11)
RDW: 13.5 % (ref 11.5–15.5)
WBC: 5.7 10*3/uL (ref 4.0–10.5)

## 2015-06-26 LAB — RAPID URINE DRUG SCREEN, HOSP PERFORMED
Amphetamines: NOT DETECTED
Barbiturates: NOT DETECTED
Benzodiazepines: NOT DETECTED
Cocaine: POSITIVE — AB
Opiates: NOT DETECTED
Tetrahydrocannabinol: NOT DETECTED

## 2015-06-26 NOTE — ED Notes (Signed)
Pt verbalized understanding of d/c instructionsand follow-up care. No further questions/concerns, VSS, assisted to lobby in wheelchair. 

## 2015-06-26 NOTE — Discharge Instructions (Signed)
You have been seen today for chest discomfort. Your imaging and lab tests showed no abnormalities. Follow up with PCP as needed if symptoms continue. Return to ED should symptoms worsen.

## 2015-06-26 NOTE — ED Provider Notes (Signed)
CSN: [id number]     Arrival date & time 06/26/15  1105 History   First MD Initiated Contact with Patient 06/26/15 1357     Chief Complaint  Patient presents with  . Chest Pain  . Anxiety     (Consider location/radiation/quality/duration/timing/severity/associated sxs/prior Treatment) HPI   Rachel Alvarado is a 50 y.o. female, with a history of schizophrenia, COPD, anemia, hypertension, and crack cocaine use, presenting to the ED with chest discomfort that has been intermittent for the last three days. Pt states that this discomfort comes on when she also feels anxious and she has been under increased stress lately. Pt describes it as an aching, lasts for a couple seconds, rates it 7/10, nonradiating. Pt has not taken any medications for this issue. Pt denies N/V, cough, shortness of breath, fever/chills, or any other complaints.      Past Medical History  Diagnosis Date  . Arthritis   . Chronic back pain   . Depression   . Anxiety   . Panic attacks   . Schizophrenia (HCC)   . Sleep apnea   . COPD (chronic obstructive pulmonary disease) (HCC)   . Hypertension   . Anemia    Past Surgical History  Procedure Laterality Date  . Cesarean section    . Partial hysterectomy    . Cholecystectomy     Family History  Problem Relation Age of Onset  . Alcohol abuse Father   . Heart disease Father   . Diabetes Mellitus II Father   . Diabetes Father   . Diabetes Mellitus II Brother   . Stroke Sister   . Osteoporosis Mother   . Heart disease Mother    Social History  Substance Use Topics  . Smoking status: Former Smoker -- 0.02 packs/day    Types: Cigarettes    Quit date: 02/03/2012  . Smokeless tobacco: Never Used  . Alcohol Use: No   OB History    No data available     Review of Systems  Constitutional: Negative for fever, chills and diaphoresis.  Respiratory: Negative for shortness of breath.   Cardiovascular: Positive for chest pain (resolved).  Gastrointestinal:  Negative for nausea, vomiting, abdominal pain and diarrhea.  Skin: Negative for color change and pallor.  Neurological: Negative for dizziness, syncope, weakness, light-headedness and headaches.  All other systems reviewed and are negative.     Allergies  Sulfa antibiotics  Home Medications   Prior to Admission medications   Medication Sig Start Date End Date Taking? Authorizing Provider  ibuprofen (ADVIL,MOTRIN) 800 MG tablet Take 1 tablet (800 mg total) by mouth 3 (three) times daily. Patient taking differently: Take 800 mg by mouth every 6 (six) hours as needed for moderate pain.  08/14/14  Yes Ladona Mow, PA-C  losartan (COZAAR) 50 MG tablet Take 50 mg by mouth daily. 02/16/15  Yes Historical Provider, MD  oxyCODONE (OXY IR/ROXICODONE) 5 MG immediate release tablet Take 1 tablet by mouth every 8 (eight) hours as needed. 06/03/15  Yes Historical Provider, MD  QUEtiapine (SEROQUEL) 400 MG tablet Take 400 mg by mouth at bedtime.   Yes Historical Provider, MD  cyclobenzaprine (FLEXERIL) 10 MG tablet Take 1 tablet (10 mg total) by mouth 2 (two) times daily as needed for muscle spasms. Patient not taking: Reported on 03/26/2015 08/14/14   Ladona Mow, PA-C  dicyclomine (BENTYL) 20 MG tablet Take 1 tablet (20 mg total) by mouth every 6 (six) hours as needed for spasms (for abdominal cramping). Patient not taking:  Reported on 03/26/2015 10/21/14   Marisa Severinlga Otter, MD  docusate sodium (COLACE) 100 MG capsule Take 1 capsule (100 mg total) by mouth every 12 (twelve) hours. Patient not taking: Reported on 03/26/2015 10/21/14   Marisa Severinlga Otter, MD  polyethylene glycol [facility](MIRALAX / Ethelene HalGLYCOLAX) packet Take one capful in 8 oz of fluid of your choice twice a day until having soft stools, then back down to once a day.  If not having soft stools with twice a day, increased to three times a day Patient not taking: Reported on 03/26/2015 10/21/14   Marisa Severinlga Otter, MD   BP 127/97 mmHg  Pulse 79  Temp(Src) 98.2 F (36.8 C) (Oral)  Resp 20   SpO2 97% Physical Exam  Constitutional: She appears well-developed and well-nourished. No distress.  HENT:  Head: Normocephalic and atraumatic.  Eyes: Conjunctivae are normal. Pupils are equal, round, and reactive to light.  Neck: Neck supple.  Cardiovascular: Normal rate, regular rhythm, normal heart sounds and intact distal pulses.   Pulmonary/Chest: Effort normal and breath sounds normal. No respiratory distress.  Abdominal: Soft. There is no tenderness. There is no guarding.  Musculoskeletal: She exhibits no edema or tenderness.  Lymphadenopathy:    She has no cervical adenopathy.  Neurological: She is alert.  Skin: Skin is warm and dry. She is not diaphoretic.  Psychiatric: She has a normal mood and affect. Her behavior is normal.  Nursing note and vitals reviewed.   ED Course  Procedures (including critical care time) Labs Review Labs Reviewed  BASIC METABOLIC PANEL - Abnormal; Notable for the following:    Potassium 3.4 (*)    Glucose, Bld 100 (*)    Calcium 8.8 (*)    All other components within normal limits  CBC - Abnormal; Notable for the following:    RBC 3.77 (*)    Hemoglobin 11.8 (*)    HCT 35.8 (*)    All other components within normal limits  URINALYSIS, ROUTINE W REFLEX MICROSCOPIC (NOT AT [facility]) - Abnormal; Notable for the following:    APPearance CLOUDY (*)    All other components within normal limits  URINE RAPID DRUG SCREEN, HOSP PERFORMED - Abnormal; Notable for the following:    Cocaine POSITIVE (*)    All other components within normal limits  I-STAT TROPOININ, ED  Rosezena SensorI-STAT TROPOININ, ED    Imaging Review Dg Chest 2 View  06/26/2015  CLINICAL DATA:  Acute chest pain. EXAM: CHEST  2 VIEW COMPARISON:  November 04, 2014. FINDINGS: The heart size and mediastinal contours are within normal limits. Both lungs are clear. No pneumothorax or pleural effusion is noted. The visualized skeletal structures are unremarkable. IMPRESSION: No active cardiopulmonary  disease. Electronically Signed   By: Lupita RaiderJames  Green Jr, M.D.   On: 06/26/2015 12:17   I have personally reviewed and evaluated these images and lab results as part of my medical decision-making.   EKG Interpretation None      MDM   Final diagnoses:  Chest pain, unspecified chest pain type    [name] presents with intermittent episodes of chest pain for the last 3 days.  Patient's presentation is more consistent with muscular discomfort versus anxiety. Low suspicion for ACS. HEART score is 1, indicating low risk for a cardiac event. Wells criteria score is 0, indicating low risk for PE. EKG shows normal sinus rhythm. 3:09 PM Patient states she "has somewhere to be." Pt would like to be discharged. Pt was informed that there were still labs pending  on her. She said she didn't care and would like to be discharged. Pt advised to return to ED should she change her mind. Patient was further advised to follow-up with her PCP on this matter. UDS positive for cocaine. Patient shows no signs of cocaine toxicity. Return precautions discussed. Patient voiced understanding of these instructions and is comfortable with discharge.  Filed Vitals:   06/26/15 1111 06/26/15 1322 06/26/15 1500  BP: 114/87 147/99 127/97  Pulse: 84 79 79  Temp: 98.2 F (36.8 C)    TempSrc: Oral    Resp: SpO2: 99% 99% 97%       Anselm Pancoast, PA-C 06/26/15 1806  Raeford Razor, MD 06/28/15 1546

## 2015-06-26 NOTE — ED Notes (Signed)
Pt arrives via POV from home with l sided chest pains off and on the last 2 days. Reports recent stressors. States the system is trying to make her have a heart attack. Pt denies medications PTA. Pt with hx of crack cocaine abuse. Will not disclose last use. Pt awake, alert, oriented x4, NAD at present. VSS.

## 2015-07-02 ENCOUNTER — Ambulatory Visit (INDEPENDENT_AMBULATORY_CARE_PROVIDER_SITE_OTHER): Payer: Medicare Other | Admitting: Family Medicine

## 2015-07-02 VITALS — BP 130/80 | HR 93 | Temp 97.9°F | Resp 16 | Ht 64.0 in | Wt 269.0 lb

## 2015-07-02 DIAGNOSIS — M545 Low back pain, unspecified: Secondary | ICD-10-CM | POA: Insufficient documentation

## 2015-07-02 DIAGNOSIS — M25562 Pain in left knee: Secondary | ICD-10-CM | POA: Diagnosis not present

## 2015-07-02 DIAGNOSIS — M25561 Pain in right knee: Secondary | ICD-10-CM

## 2015-07-02 NOTE — Patient Instructions (Addendum)
[facility] - We Treat All Types of Pain - [facility]ps[facility]com? [email or web address]/?  [phone]  [facility] can handle all pain relief. Create an appointment to take care of the pain!

## 2015-07-02 NOTE — Progress Notes (Signed)
This 50 year old schizophrenic woman who comes in requesting oxycodone for her chronic back pain and chronic knee pain. Nothing is changed recently and she's not having any chest pain, shortness of breath, abdominal pain, urinary symptoms, fever, headache, or stiff neck.  Objective: Patient is a morbidly obese woman who is alert and cooperative BP 130/80 mmHg  Pulse 93  Temp(Src) 97.9 F (36.6 C) (Oral)  Resp 16  Ht 5\' 4"  (1.626 m)  Wt 269 lb (122.018 kg)  BMI 46.15 kg/m2  SpO2 98% HEENT is unremarkable Patient is morbidly obese She has no localized tenderness in her back Patient has good range of motion both knees and is ambulatory with a waddling type gait .  Bilateral low back pain without sciatica - Plan: Ambulatory referral to Pain Clinic  Knee pain, bilateral - Plan: Ambulatory referral to Pain Clinic  Rachel SidleKurt Latrisa Hellums, MD

## 2015-07-16 ENCOUNTER — Other Ambulatory Visit: Payer: Self-pay

## 2015-07-16 ENCOUNTER — Ambulatory Visit: Payer: Medicare Other

## 2015-07-16 ENCOUNTER — Ambulatory Visit
Admission: RE | Admit: 2015-07-16 | Discharge: 2015-07-16 | Disposition: A | Discharge status: Home / Self Care | Payer: Medicare Other | Source: Ambulatory Visit

## 2015-07-16 DIAGNOSIS — Z1231 Encounter for screening mammogram for malignant neoplasm of breast: Secondary | ICD-10-CM

## 2016-04-16 ENCOUNTER — Emergency Department (HOSPITAL_COMMUNITY)
Admission: EM | Admit: 2016-04-16 | Discharge: 2016-04-16 | Disposition: A | Discharge status: Home / Self Care | Payer: Medicare HMO | Attending: Emergency Medicine | Admitting: Emergency Medicine

## 2016-04-16 ENCOUNTER — Encounter (HOSPITAL_COMMUNITY): Payer: Self-pay

## 2016-04-16 DIAGNOSIS — Z7982 Long term (current) use of aspirin: Secondary | ICD-10-CM | POA: Diagnosis not present

## 2016-04-16 DIAGNOSIS — K6289 Other specified diseases of anus and rectum: Secondary | ICD-10-CM | POA: Diagnosis present

## 2016-04-16 DIAGNOSIS — K5900 Constipation, unspecified: Secondary | ICD-10-CM

## 2016-04-16 DIAGNOSIS — Z87891 Personal history of nicotine dependence: Secondary | ICD-10-CM | POA: Diagnosis not present

## 2016-04-16 DIAGNOSIS — Z79899 Other long term (current) drug therapy: Secondary | ICD-10-CM | POA: Diagnosis not present

## 2016-04-16 DIAGNOSIS — J449 Chronic obstructive pulmonary disease, unspecified: Secondary | ICD-10-CM | POA: Insufficient documentation

## 2016-04-16 DIAGNOSIS — I10 Essential (primary) hypertension: Secondary | ICD-10-CM | POA: Diagnosis not present

## 2016-04-16 MED ORDER — BELLADONNA ALKALOIDS-OPIUM 16.2-60 MG RE SUPP
1.0000 | Freq: Once | RECTAL | Status: AC
  Administered 2016-04-16: 1 via RECTAL
  Filled 2016-04-16: qty 1

## 2016-04-16 MED ORDER — SORBITOL 70 % SOLN
960.0000 mL | TOPICAL_OIL | Freq: Once | ORAL | Status: AC
  Administered 2016-04-16: 960 mL via RECTAL
  Filled 2016-04-16: qty 240

## 2016-04-16 MED ORDER — SENNOSIDES-DOCUSATE SODIUM 8.6-50 MG PO TABS: 1.0000 | ORAL_TABLET | Freq: Every day | ORAL | 0 refills | Status: AC

## 2016-04-16 MED ORDER — BELLADONNA-OPIUM 16.2-30 MG RE SUPP: 30.0000 mg | Freq: Three times a day (TID) | RECTAL | 0 refills | Status: DC | PRN

## 2016-04-16 MED ORDER — POLYETHYLENE GLYCOL 3350 17 G PO PACK: 17.0000 g | PACK | Freq: Every day | ORAL | 0 refills | Status: AC

## 2016-04-16 NOTE — ED Notes (Addendum)
Pt was only able to take 12980ml-240ml of SMOG then she needed to use the bathroom. Upon return to her room and a second attempt at getting the [facility] her, the patient stated she was not able to tolerate the tube in her rectum. Pt refused the remainder of the SMOG. Physician notified.

## 2016-04-16 NOTE — ED Triage Notes (Signed)
Patient arrived by EMS for 4 days of constipation. Patient states she has been using MOM, and stool softner for same with no relief. Patient also wants left side of head checked for chronic headache.

## 2016-04-16 NOTE — ED Notes (Signed)
Admitting at bedside 

## 2016-04-16 NOTE — ED Provider Notes (Signed)
[facility]-EMERGENCY DEPT Provider Note   CSN: [id number] Arrival date & time: 04/16/16  1415     History   Chief Complaint No chief complaint on file.   HPI Rachel Alvarado is a 51 y.o. female.  HPI  Patient presents with concern of constipation, rectal discomfort. Patient has a history of hemorrhoids, but no recurrent constipation. Over the past 4 days patient has had increasing rectal pressure, discomfort with attempts at defecation, with trace bleeding on tissue paper. No anterior abdominal pain, though she has a full sensation. Associated anorexia, no vomiting. No fever, no chills. Patient also complains of headache, though she notes that she has had headache, left-sided, for at least 6 months, unchanged, with no new weakness in any extremities, no vision changes. She has been evaluated by her primary care physician for her persistent headache.  During this illness she has had one milk of magnesia oral treatment with no relief.   Past Medical History:  Diagnosis Date  . Anemia   . Anxiety   . Arthritis   . Chronic back pain   . COPD (chronic obstructive pulmonary disease) (HCC)   . Depression   . Hypertension   . Panic attacks   . Schizophrenia (HCC)   . Sleep apnea     Patient Active Problem List   Diagnosis Date Noted  . Bilateral low back pain without sciatica 07/02/2015  . Knee pain, bilateral 07/02/2015  . Breast pain, right 11/05/2013  . Dizziness and giddiness 05/28/2012  . Screening for STDs (sexually transmitted diseases) 04/24/2012  . Schizophrenia (HCC) 04/03/2012  . History of sleep apnea 04/03/2012  . Arthritis of both knees 04/03/2012  . Anxiety and depression 04/03/2012  . Musculoskeletal chest pain 04/03/2012  . Preventative health care 04/03/2012  . Morbid obesity with BMI of 45.0-49.9, adult (HCC) 04/03/2012    Past Surgical History:  Procedure Laterality Date  . CESAREAN SECTION    . CHOLECYSTECTOMY    . PARTIAL HYSTERECTOMY      OB  History    No data available       Home Medications    Prior to Admission medications   Medication Sig Start Date End Date Taking? Authorizing Provider  aspirin 81 MG EC tablet Take 81 mg by mouth daily. Swallow whole.   Yes Historical Provider, MD  Cholecalciferol (VITAMIN D-3) 5000 units TABS Take 5,000 Units by mouth daily.   Yes Historical Provider, MD  docusate sodium (COLACE) 100 MG capsule Take 200 mg by mouth daily.   Yes Historical Provider, MD  losartan (COZAAR) 100 MG tablet Take 100 mg by mouth daily.   Yes Historical Provider, MD  Multiple Vitamin (MULTIVITAMIN WITH MINERALS) TABS tablet Take 1 tablet by mouth daily.   Yes Historical Provider, MD  QUEtiapine (SEROQUEL) 400 MG tablet Take 400 mg by mouth at bedtime.   Yes Historical Provider, MD    Family History Family History  Problem Relation Age of Onset  . Alcohol abuse Father   . Heart disease Father   . Diabetes Mellitus II Father   . Diabetes Father   . Osteoporosis Mother   . Heart disease Mother   . Diabetes Mellitus II Brother   . Stroke Sister     Social History Social History  Substance Use Topics  . Smoking status: Former Smoker    Packs/day: 0.02    Types: Cigarettes    Quit date: 02/03/2012  . Smokeless tobacco: Never Used  . Alcohol use No  Allergies   Sulfa antibiotics   Review of Systems Review of Systems  Constitutional:       Per HPI, otherwise negative  HENT:       Per HPI, otherwise negative  Respiratory:       Per HPI, otherwise negative  Cardiovascular:       Per HPI, otherwise negative  Gastrointestinal: Positive for constipation and nausea. Negative for vomiting.  Endocrine:       Negative aside from HPI  Genitourinary:       Neg aside from HPI   Musculoskeletal:       Per HPI, otherwise negative  Skin: Negative.   Neurological: Positive for headaches. Negative for syncope.     Physical Exam Updated Vital Signs BP 133/82   Pulse 86   Temp 98.1 F (36.7  C) (Oral)   Resp 20   SpO2 99%   Physical Exam  Constitutional: She is oriented to person, place, and time. She appears well-developed and well-nourished. No distress.  HENT:  Head: Normocephalic and atraumatic.  Eyes: Conjunctivae and EOM are normal.  Cardiovascular: Normal rate and regular rhythm.   Pulmonary/Chest: Effort normal and breath sounds normal. No stridor. No respiratory distress.  Abdominal: She exhibits no distension.    Genitourinary:     Musculoskeletal: She exhibits no edema.  Neurological: She is alert and oriented to person, place, and time. She displays no atrophy and no tremor. No cranial nerve deficit or sensory deficit. She exhibits normal muscle tone. She displays no seizure activity. Coordination normal.  Skin: Skin is warm and dry.  Psychiatric: Her mood appears anxious.  Nursing note and vitals reviewed.    ED Treatments / Results   Procedures Procedures (including critical care time)  Medications Ordered in ED Medications  opium-belladonna (B&O SUPPRETTES) 16.2-60 MG suppository 1 suppository (not administered)  sorbitol, milk of mag, mineral oil, glycerin (SMOG) enema (not administered)     Initial Impression / Assessment and Plan / ED Course  I have reviewed the triage vital signs and the nursing notes.  Pertinent labs & imaging results that were available during my care of the patient were reviewed by me and considered in my medical decision making (see chart for details).    10:43 PM Patient sitting upright, in no distress. Patient has received belladonna opiate suppository, enema, states that she had one bowel movement. Patient discharged with ongoing therapy.  This patient presented with concern of rectal discomfort, constipation received analgesia here, enema here, had some progressive stool, reduced symptoms. No evidence for obstruction given her soft abdomen, absence of vomiting, and generally benign status after her  therapy. Patient discharged with ongoing meds to follow-up with primary care.  Final Clinical Impressions(s) / ED Diagnoses   Final diagnoses:  Rectal pain  Constipation, unspecified constipation type    New Prescriptions New Prescriptions   BELLADONNA-OPIUM (B&O SUPPRETTES) 16.2-30 MG SUPPOSITORY    Place 1 suppository rectally every 8 (eight) hours as needed for pain.   POLYETHYLENE GLYCOL (MIRALAX / GLYCOLAX) PACKET    Take 17 g by mouth daily.   SENNA-DOCUSATE (SENOKOT-S) 8.6-50 MG TABLET    Take 1 tablet by mouth daily.     Gerhard Munch, MD 04/16/16 2244

## 2016-04-23 ENCOUNTER — Encounter (HOSPITAL_COMMUNITY): Payer: Self-pay | Admitting: Emergency Medicine

## 2016-04-23 ENCOUNTER — Emergency Department (HOSPITAL_COMMUNITY)
Admission: EM | Admit: 2016-04-23 | Discharge: 2016-04-24 | Disposition: A | Discharge status: Other Acute Inpatient Hospital | Payer: Medicare HMO | Attending: Emergency Medicine | Admitting: Emergency Medicine

## 2016-04-23 DIAGNOSIS — T43501A Poisoning by unspecified antipsychotics and neuroleptics, accidental (unintentional), initial encounter: Secondary | ICD-10-CM | POA: Diagnosis present

## 2016-04-23 DIAGNOSIS — J449 Chronic obstructive pulmonary disease, unspecified: Secondary | ICD-10-CM | POA: Diagnosis not present

## 2016-04-23 DIAGNOSIS — I10 Essential (primary) hypertension: Secondary | ICD-10-CM | POA: Insufficient documentation

## 2016-04-23 DIAGNOSIS — F1910 Other psychoactive substance abuse, uncomplicated: Secondary | ICD-10-CM

## 2016-04-23 DIAGNOSIS — Z79899 Other long term (current) drug therapy: Secondary | ICD-10-CM | POA: Diagnosis not present

## 2016-04-23 DIAGNOSIS — Z7982 Long term (current) use of aspirin: Secondary | ICD-10-CM | POA: Diagnosis not present

## 2016-04-23 DIAGNOSIS — R45851 Suicidal ideations: Secondary | ICD-10-CM | POA: Diagnosis not present

## 2016-04-23 DIAGNOSIS — T50902A Poisoning by unspecified drugs, medicaments and biological substances, intentional self-harm, initial encounter: Secondary | ICD-10-CM

## 2016-04-23 DIAGNOSIS — Z87891 Personal history of nicotine dependence: Secondary | ICD-10-CM | POA: Diagnosis not present

## 2016-04-23 LAB — COMPREHENSIVE METABOLIC PANEL
ALT: 20 U/L (ref 14–54)
ANION GAP: 8 (ref 5–15)
AST: 22 U/L (ref 15–41)
Albumin: 3.3 g/dL — ABNORMAL LOW (ref 3.5–5.0)
Alkaline Phosphatase: 47 U/L (ref 38–126)
BUN: 10 mg/dL (ref 6–20)
CHLORIDE: 106 mmol/L (ref 101–111)
CO2: 26 mmol/L (ref 22–32)
CREATININE: 1.04 mg/dL — AB (ref 0.44–1.00)
Calcium: 8.8 mg/dL — ABNORMAL LOW (ref 8.9–10.3)
Glucose, Bld: 160 mg/dL — ABNORMAL HIGH (ref 65–99)
POTASSIUM: 3.5 mmol/L (ref 3.5–5.1)
SODIUM: 140 mmol/L (ref 135–145)
Total Bilirubin: 0.4 mg/dL (ref 0.3–1.2)
Total Protein: 6.5 g/dL (ref 6.5–8.1)

## 2016-04-23 LAB — CBC WITH DIFFERENTIAL/PLATELET
Basophils Absolute: 0 10*3/uL (ref 0.0–0.1)
Basophils Relative: 0 %
EOS ABS: 0 10*3/uL (ref 0.0–0.7)
EOS PCT: 1 %
HCT: 35 % — ABNORMAL LOW (ref 36.0–46.0)
Hemoglobin: 11.3 g/dL — ABNORMAL LOW (ref 12.0–15.0)
LYMPHS PCT: 36 %
Lymphs Abs: 2.1 10*3/uL (ref 0.7–4.0)
MCH: 30.1 pg (ref 26.0–34.0)
MCHC: 32.3 g/dL (ref 30.0–36.0)
MCV: 93.1 fL (ref 78.0–100.0)
MONO ABS: 0.4 10*3/uL (ref 0.1–1.0)
Monocytes Relative: 7 %
Neutro Abs: 3.2 10*3/uL (ref 1.7–7.7)
Neutrophils Relative %: 56 %
PLATELETS: 250 10*3/uL (ref 150–400)
RBC: 3.76 MIL/uL — ABNORMAL LOW (ref 3.87–5.11)
RDW: 13.2 % (ref 11.5–15.5)
WBC: 5.7 10*3/uL (ref 4.0–10.5)

## 2016-04-23 LAB — RAPID URINE DRUG SCREEN, HOSP PERFORMED
AMPHETAMINES: NOT DETECTED
BARBITURATES: NOT DETECTED
BENZODIAZEPINES: NOT DETECTED
COCAINE: POSITIVE — AB
OPIATES: NOT DETECTED
Tetrahydrocannabinol: POSITIVE — AB

## 2016-04-23 LAB — ACETAMINOPHEN LEVEL: Acetaminophen (Tylenol), Serum: <10 ug/mL — ABNORMAL LOW (ref 10–30)

## 2016-04-23 LAB — ETHANOL: Alcohol, Ethyl (B): <5 mg/dL (ref <5)

## 2016-04-23 LAB — SALICYLATE LEVEL: Salicylate Lvl: <7 mg/dL (ref 2.8–30.0)

## 2016-04-23 MED ORDER — SENNOSIDES-DOCUSATE SODIUM 8.6-50 MG PO TABS
1.0000 | ORAL_TABLET | Freq: Every day | ORAL | Status: DC
  Administered 2016-04-24: 1 via ORAL
  Filled 2016-04-23: qty 1

## 2016-04-23 MED ORDER — ASPIRIN EC 81 MG PO TBEC
81.0000 mg | DELAYED_RELEASE_TABLET | Freq: Every day | ORAL | Status: DC
  Administered 2016-04-24: 81 mg via ORAL
  Filled 2016-04-23: qty 1

## 2016-04-23 MED ORDER — ALUM & MAG HYDROXIDE-SIMETH 200-200-20 MG/5ML PO SUSP: 30.0000 mL | ORAL | Status: DC | PRN

## 2016-04-23 MED ORDER — SODIUM CHLORIDE 0.9 % IV SOLN: INTRAVENOUS | Status: DC

## 2016-04-23 MED ORDER — ADULT MULTIVITAMIN W/MINERALS CH
1.0000 | ORAL_TABLET | Freq: Every day | ORAL | Status: DC
  Administered 2016-04-24: 1 via ORAL
  Filled 2016-04-23: qty 1

## 2016-04-23 MED ORDER — ONDANSETRON HCL 4 MG PO TABS: 4.0000 mg | ORAL_TABLET | Freq: Three times a day (TID) | ORAL | Status: DC | PRN

## 2016-04-23 MED ORDER — DOCUSATE SODIUM 100 MG PO CAPS
200.0000 mg | ORAL_CAPSULE | Freq: Every day | ORAL | Status: DC
  Administered 2016-04-24: 200 mg via ORAL
  Filled 2016-04-23: qty 2

## 2016-04-23 MED ORDER — IBUPROFEN 400 MG PO TABS: 600.0000 mg | ORAL_TABLET | Freq: Three times a day (TID) | ORAL | Status: DC | PRN

## 2016-04-23 MED ORDER — ACETAMINOPHEN 325 MG PO TABS: 650.0000 mg | ORAL_TABLET | ORAL | Status: DC | PRN

## 2016-04-23 MED ORDER — SODIUM CHLORIDE 0.9 % IV BOLUS (SEPSIS)
1000.0000 mL | Freq: Once | INTRAVENOUS | Status: AC
  Administered 2016-04-23: 1000 mL via INTRAVENOUS

## 2016-04-23 MED ORDER — LOSARTAN POTASSIUM 50 MG PO TABS
100.0000 mg | ORAL_TABLET | Freq: Every day | ORAL | Status: DC
  Administered 2016-04-24: 100 mg via ORAL
  Filled 2016-04-23: qty 2

## 2016-04-23 NOTE — ED Notes (Signed)
Revonda StandardAllison, [employer], called and advised they are going to close case for pt.

## 2016-04-23 NOTE — ED Notes (Addendum)
[facility]. Pt noted to be sleeping. Respirations even, unlabored. Snorous respirations noted. Pt's belongings placed in 1 labeled belongings bag at nurses' desk for inventory. Will administer po meds ordered when pt awakens.

## 2016-04-23 NOTE — ED Notes (Signed)
Pt noted to be too drowsy/sleepy to take po meds.

## 2016-04-23 NOTE — ED Notes (Signed)
Sitter remains at bedside,  

## 2016-04-23 NOTE — ED Notes (Signed)
Sitter at bedside,  

## 2016-04-23 NOTE — ED Notes (Signed)
[place], Staffing Office, aware of pt's new location.

## 2016-04-23 NOTE — ED Notes (Signed)
When RN ask patient was she suidual Patient stated, " yes,  I wanted to sleep a very long time"

## 2016-04-23 NOTE — ED Notes (Signed)
May d/c IV fluid order per Dr Madilyn Hookees.

## 2016-04-23 NOTE — ED Triage Notes (Signed)
Patients today from home with complaint of Drug overdose. Patient states she took 5 400mg  tablets of Seroquel "to sleep" . Patient endorse suicidal thoughts. Patient lethargic during assessment patient responds to voice. Patient states, " I just want to sleep".

## 2016-04-23 NOTE — ED Provider Notes (Signed)
[facility]Y DEPT Provider Note   CSN: [id number] Arrival date & time: 04/23/16  1016     History   Chief Complaint Chief Complaint  Patient presents with  . Drug Overdose    HPI Rachel Alvarado is a 51 y.o. female.  Pt presents to the ED today with a seroquel overdose.  The pt took 2 about 3 hours ago.  She took an additional 3 prior to EMS arrival.  Pt did tell EMS and the ED nurse that she had some suicidal thoughts.  She told me that she just wanted to sleep.  The pt is very drowsy on exam, and is a poor historian.  ED nurse contacted poison control which recommended cardiac monitoring for 6 hrs to watch for QT prolongation and tachycardia and to watch for extreme drowsiness.      Past Medical History:  Diagnosis Date  . Anemia   . Anxiety   . Arthritis   . Chronic back pain   . COPD (chronic obstructive pulmonary disease) (HCC)   . Depression   . Hypertension   . Panic attacks   . Schizophrenia (HCC)   . Sleep apnea     Patient Active Problem List   Diagnosis Date Noted  . Bilateral low back pain without sciatica 07/02/2015  . Knee pain, bilateral 07/02/2015  . Breast pain, right 11/05/2013  . Dizziness and giddiness 05/28/2012  . Screening for STDs (sexually transmitted diseases) 04/24/2012  . Schizophrenia (HCC) 04/03/2012  . History of sleep apnea 04/03/2012  . Arthritis of both knees 04/03/2012  . Anxiety and depression 04/03/2012  . Musculoskeletal chest pain 04/03/2012  . Preventative health care 04/03/2012  . Morbid obesity with BMI of 45.0-49.9, adult (HCC) 04/03/2012    Past Surgical History:  Procedure Laterality Date  . CESAREAN SECTION    . CHOLECYSTECTOMY    . PARTIAL HYSTERECTOMY      OB History    No data available       Home Medications    Prior to Admission medications   Medication Sig Start Date End Date Taking? Authorizing Provider  aspirin 81 MG EC tablet Take 81 mg by mouth daily. Swallow whole.    Historical Provider,  MD  belladonna-opium (B&O SUPPRETTES) 16.2-30 MG suppository Place 1 suppository rectally every 8 (eight) hours as needed for pain. 04/16/16   Gerhard Munchobert Lockwood, MD  Cholecalciferol (VITAMIN D-3) 5000 units TABS Take 5,000 Units by mouth daily.    Historical Provider, MD  docusate sodium (COLACE) 100 MG capsule Take 200 mg by mouth daily.    Historical Provider, MD  losartan (COZAAR) 100 MG tablet Take 100 mg by mouth daily.    Historical Provider, MD  Multiple Vitamin (MULTIVITAMIN WITH MINERALS) TABS tablet Take 1 tablet by mouth daily.    Historical Provider, MD  polyethylene glycol (MIRALAX / GLYCOLAX) packet Take 17 g by mouth daily. 04/16/16   Gerhard Munchobert Lockwood, MD  QUEtiapine (SEROQUEL) 400 MG tablet Take 400 mg by mouth at bedtime.    Historical Provider, MD  senna-docusate (SENOKOT-S) 8.6-50 MG tablet Take 1 tablet by mouth daily. 04/16/16   Gerhard Munchobert Lockwood, MD    Family History Family History  Problem Relation Age of Onset  . Alcohol abuse Father   . Heart disease Father   . Diabetes Mellitus II Father   . Diabetes Father   . Osteoporosis Mother   . Heart disease Mother   . Diabetes Mellitus II Brother   . Stroke Sister  Social History Social History  Substance Use Topics  . Smoking status: Former Smoker    Packs/day: 0.02    Types: Cigarettes    Quit date: 02/03/2012  . Smokeless tobacco: Never Used  . Alcohol use No     Allergies   Sulfa antibiotics   Review of Systems Review of Systems  Psychiatric/Behavioral: Positive for sleep disturbance and suicidal ideas.  All other systems reviewed and are negative.    Physical Exam Updated Vital Signs BP 112/77   Pulse 90   Temp 98.2 F (36.8 C) (Oral)   Resp 15   SpO2 100%   Physical Exam  Constitutional: She appears well-developed and well-nourished.  HENT:  Head: Normocephalic and atraumatic.  Right Ear: External ear normal.  Left Ear: External ear normal.  Nose: Nose normal.  Mouth/Throat: Oropharynx  is clear and moist.  Eyes: Conjunctivae and EOM are normal. Pupils are equal, round, and reactive to light.  Neck: Normal range of motion. Neck supple.  Cardiovascular: Regular rhythm, normal heart sounds and intact distal pulses.  Tachycardia present.   Pulmonary/Chest: Effort normal and breath sounds normal.  Abdominal: Soft. Bowel sounds are normal.  Musculoskeletal: Normal range of motion.  Neurological:  Awake and talking, but sleepy  Skin: Skin is warm.  Psychiatric: Her affect is labile. Her speech is slurred. She is slowed. She is inattentive.  Nursing note and vitals reviewed.    ED Treatments / Results  Labs (all labs ordered are listed, but only abnormal results are displayed) Labs Reviewed  CBC WITH DIFFERENTIAL/PLATELET - Abnormal; Notable for the following:       Result Value   RBC 3.76 (*)    Hemoglobin 11.3 (*)    HCT 35.0 (*)    All other components within normal limits  COMPREHENSIVE METABOLIC PANEL - Abnormal; Notable for the following:    Glucose, Bld 160 (*)    Creatinine, Ser 1.04 (*)    Calcium 8.8 (*)    Albumin 3.3 (*)    All other components within normal limits  RAPID URINE DRUG SCREEN, HOSP PERFORMED - Abnormal; Notable for the following:    Cocaine POSITIVE (*)    Tetrahydrocannabinol POSITIVE (*)    All other components within normal limits  URINALYSIS, ROUTINE W REFLEX MICROSCOPIC - Abnormal; Notable for the following:    Color, Urine YELLOW (*)    APPearance TURBID (*)    Squamous Epithelial / LPF 0-5 (*)    All other components within normal limits  ACETAMINOPHEN LEVEL - Abnormal; Notable for the following:    Acetaminophen (Tylenol), Serum <10 (*)    All other components within normal limits  SALICYLATE LEVEL  ETHANOL    EKG  EKG Interpretation  Date/Time:  Saturday April 23 2016 10:32:51 EST Ventricular Rate:  114 PR Interval:    QRS Duration: 87 QT Interval:  327 QTC Calculation: 451 R Axis:   52 Text Interpretation:   Sinus tachycardia Borderline T wave abnormalities Confirmed by Markian Glockner MD, Angelia Hazell ([id number]) on 04/23/2016 10:36:05 AM       Radiology No results found.  Procedures Procedures (including critical care time)  Medications Ordered in ED Medications  sodium chloride 0.9 % bolus 1,000 mL (1,000 mLs Intravenous New Bag/Given 04/23/16 1039)    And  0.9 %  sodium chloride infusion (not administered)  ondansetron (ZOFRAN) tablet 4 mg (not administered)  alum & mag hydroxide-simeth (MAALOX/MYLANTA) 200-200-20 MG/5ML suspension 30 mL (not administered)  ibuprofen (ADVIL,MOTRIN) tablet 600 mg (not administered)  acetaminophen (TYLENOL) tablet 650 mg (not administered)     Initial Impression / Assessment and Plan / ED Course  I have reviewed the triage vital signs and the nursing notes.  Pertinent labs & imaging results that were available during my care of the patient were reviewed by me and considered in my medical decision making (see chart for details).     Pt observed for many hours.  She is no longer tachycardic, but is still somnolent.  She is medically cleared for TTS consult.  Pt to be placed in a psych hold.  Final Clinical Impressions(s) / ED Diagnoses   Final diagnoses:  Suicidal ideation  Intentional drug overdose, initial encounter [facility])  Polysubstance abuse    New Prescriptions New Prescriptions   No medications on file     Jacalyn Lefevre, MD 04/23/16 1526

## 2016-04-23 NOTE — ED Notes (Addendum)
Posion Control called for Follow- Up states Repeat EKG at 1630.

## 2016-04-23 NOTE — BHH Counselor (Signed)
Called RN to assess pt by telepsych machine. Per nursing report pt is not alert enough to participate in assessment. Writer requested the consult be removed and put back in when pt is alert. RN stated she would tell provider.   [address]rist[address] Maysoon Lozada [facility], [address]

## 2016-04-23 NOTE — ED Notes (Signed)
Sitter remains at bedside

## 2016-04-23 NOTE — ED Notes (Addendum)
poison control called and recommends the following: -Cardiac monitoring for 6 hours ( prolonged QT and tachycardia) -extreme drowsiness

## 2016-04-24 DIAGNOSIS — T43501A Poisoning by unspecified antipsychotics and neuroleptics, accidental (unintentional), initial encounter: Secondary | ICD-10-CM | POA: Diagnosis not present

## 2016-04-24 LAB — URINALYSIS, ROUTINE W REFLEX MICROSCOPIC
BACTERIA UA: NONE SEEN
BILIRUBIN URINE: NEGATIVE
Glucose, UA: NEGATIVE mg/dL
Hgb urine dipstick: NEGATIVE
KETONES UR: NEGATIVE mg/dL
Leukocytes, UA: NEGATIVE
NITRITE: NEGATIVE
PH: 5 (ref 5.0–8.0)
PROTEIN: NEGATIVE mg/dL
Specific Gravity, Urine: 1.026 (ref 1.005–1.030)
WBC UA: NONE SEEN WBC/hpf (ref 0–5)

## 2016-04-24 NOTE — ED Notes (Signed)
Pt requesting to take her Seroquel. Pt informed that RN would have to talk to the MD and that no Seroquel has been ordered. Pt informed that she is here due to overdosing on her Seroquel.

## 2016-04-24 NOTE — ED Provider Notes (Signed)
BH team indicates patient accepted [facility], Dr Loyola Mastornwall.  Patient appears alert, and in no acute distress.  Vitals:   04/24/16 0634 04/24/16 1143  BP: 102/59 105/60  Pulse: 76 86  Resp: 20 19  Temp: 97.1 F (36.2 C) 99 F (37.2 C)   Patient currently appears stable for transfer.      Cathren LaineKevin Elaiza Shoberg, MD 04/24/16 1239

## 2016-04-24 NOTE — ED Notes (Signed)
Pt aware transportation is here.

## 2016-04-24 NOTE — Progress Notes (Addendum)
Patient accepted at [facility]. Patient will go to directly to admissions. Accepting physician is Dr. Loyola Mastornwall, call report #[phone]. [facility] RN Kriste BasqueBecky has been notified.   Rachel Alvarado, LCSWA Disposition staff 04/24/2016 10:51 AM

## 2016-04-24 NOTE — ED Notes (Signed)
Stretcher traded for bed for comfort.

## 2016-04-24 NOTE — ED Notes (Signed)
Pt to be informed that she will not receive seroquel at this time due to recent over dose when the pt wakes back up.

## 2016-04-24 NOTE — ED Notes (Signed)
Pt given snack and sprite. ?

## 2016-04-24 NOTE — Progress Notes (Signed)
Patient has been referred for inpatient treatment to the following [facility]  [place]  CSW will continue to seek placement and follow up.  Mel[place] Abtsatia Cavon Nicolls, LCSWA Disposition staff 04/24/2016 9:57 AM

## 2016-04-24 NOTE — ED Notes (Signed)
Pt aware [facility]. States will arrange for transportation back.

## 2016-04-24 NOTE — BH Assessment (Addendum)
Tele Assessment Note   Rachel Alvarado is an 51 y.o. female. Pt denies SI/HI. Pt reports ongoing AVH. {Pt states that she hears voices and sees people that other people do not see or hear. According to the PT, she overdosed on Seroquel in order to sleep. Pt states she took her prescribed doseage and it didn't help her sleep so she took more. Pt reports 1 previous SI attempt (a long time ago). Pt states she has a lot on her mind. Pt reports many stressors but could not state her stressors. Pt reports previous hospitalizations for SI. [facility]. Pt reports recent crack cocaine and alcohol use. Pt is prescribed Seroquel from [facility].   Writer consulted with Jacki ConesLaurie, NP Pt meets inpatient criteria. TTS to seek placement.  Diagnosis: F20.9 Schizoaffective, Depressive Type  Past Medical History:  Past Medical History:  Diagnosis Date  . Anemia   . Anxiety   . Arthritis   . Chronic back pain   . COPD (chronic obstructive pulmonary disease) (HCC)   . Depression   . Hypertension   . Panic attacks   . Schizophrenia (HCC)   . Sleep apnea     Past Surgical History:  Procedure Laterality Date  . CESAREAN SECTION    . CHOLECYSTECTOMY    . PARTIAL HYSTERECTOMY      Family History:  Family History  Problem Relation Age of Onset  . Alcohol abuse Father   . Heart disease Father   . Diabetes Mellitus II Father   . Diabetes Father   . Osteoporosis Mother   . Heart disease Mother   . Diabetes Mellitus II Brother   . Stroke Sister     Social History:  reports that she quit smoking about 4 years ago. Her smoking use included Cigarettes. She smoked 0.02 packs per day. She has never used smokeless tobacco. She reports that she does not drink alcohol or use drugs.  Additional Social History:  Alcohol / Drug Use Pain Medications: Pt denies Prescriptions: Seriquel Over the Counter: Pt denies History of alcohol / drug use?: Yes Longest period  of sobriety (when/how long): NA Substance #1 Name of Substance 1: crack cocaine 1 - Age of First Use: unknown 1 - Amount (size/oz): unknown 1 - Frequency: daily 1 - Duration: ongoing 1 - Last Use / Amount: 04/22/16 Substance #2 Name of Substance 2: alcohol 2 - Age of First Use: unknown 2 - Amount (size/oz): unknown 2 - Frequency: occasional 2 - Duration: ongoing 2 - Last Use / Amount: 04/22/16  CIWA: CIWA-Ar BP: 102/59 Pulse Rate: 76 COWS:    PATIENT STRENGTHS: (choose at least two) Average or above average intelligence Supportive family/friends  Allergies:  Allergies  Allergen Reactions  . Sulfa Antibiotics Hives    Home Medications:  (Not in a hospital admission)  OB/GYN Status:  No LMP recorded. Patient has had a hysterectomy.  General Assessment Data [facility] TTS Assessment: In system Is this a Tele or Face-to-Face Assessment?: Tele Assessment Is this an Initial Assessment or a Re-assessment for this encounter?: Initial Assessment Marital status: Single Maiden name: na Is patient pregnant?: No Pregnancy Status: No Living Arrangements: Alone Can pt return to current living arrangement?: Yes Admission Status: Voluntary Is patient capable of signing voluntary admission?: Yes Referral Source: Self/Family/Friend Insurance type: Medicare     Crisis Care Plan Living Arrangements: Alone Legal Guardian: Other: (self) Name of Psychiatrist: Dr. Wynelle LinkSun Name of  Therapist: NA  Education Status Is patient currently in school?: No Current Grade: NA Highest grade of school patient has completed: some college Name of school: NA Contact person: NA  Risk to self with the past 6 months Suicidal Ideation: No Has patient been a risk to self within the past 6 months prior to admission? : No Suicidal Intent: No Has patient had any suicidal intent within the past 6 months prior to admission? : No Is patient at risk for suicide?: No Suicidal Plan?:  No Has patient had any suicidal plan within the past 6 months prior to admission? : No Access to Means: No What has been your use of drugs/alcohol within the last 12 months?: crack cocaine and alcohol Previous Attempts/Gestures: Yes How many times?: 1 Other Self Harm Risks: SA Triggers for Past Attempts: None known Intentional Self Injurious Behavior: None Family Suicide History: No Recent stressful life event(s): Other (Comment) (Pt states "I have a lot on my mind") Persecutory voices/beliefs?: No Depression: Yes Depression Symptoms: Tearfulness, Isolating, Loss of interest in usual pleasures, Feeling worthless/self pity, Feeling angry/irritable Substance abuse history and/or treatment for substance abuse?: Yes Suicide prevention information given to non-admitted patients: Not applicable  Risk to Others within the past 6 months Homicidal Ideation: No Does patient have any lifetime risk of violence toward others beyond the six months prior to admission? : No Thoughts of Harm to Others: No Current Homicidal Intent: No Current Homicidal Plan: No Access to Homicidal Means: No Identified Victim: NA History of harm to others?: No Assessment of Violence: None Noted Violent Behavior Description: NA Does patient have access to weapons?: No Criminal Charges Pending?: No Does patient have a court date: No Is patient on probation?: No  Psychosis Hallucinations: Auditory, Visual Delusions: None noted  Mental Status Report Appearance/Hygiene: In scrubs Eye Contact: Fair Motor Activity: Freedom of movement Speech: Logical/coherent Level of Consciousness: Drowsy Mood: Depressed Affect: Depressed Anxiety Level: Minimal Thought Processes: Coherent, Relevant Judgement: Unimpaired Orientation: Person, Place, Time, Situation Obsessive Compulsive Thoughts/Behaviors: None  Cognitive Functioning Concentration: Normal Memory: Recent Intact, Remote Intact IQ: Average Insight:  Fair Impulse Control: Fair Appetite: Fair Weight Loss: 0 Weight Gain: 0 Sleep: Decreased Total Hours of Sleep: 5 Vegetative Symptoms: None  ADLScreening [facility] Assessment Services) Patient's cognitive ability adequate to safely complete daily activities?: Yes Patient able to express need for assistance with ADLs?: Yes Independently performs ADLs?: Yes (appropriate for developmental age)  Prior Inpatient Therapy Prior Inpatient Therapy: Yes Prior Therapy Dates: 2017 Prior Therapy Facilty/Provider(s):  [facility] Reason for Treatment: MDD, schizophrenia  Prior Outpatient Therapy Prior Outpatient Therapy: Yes Prior Therapy Dates: 2018 Prior Therapy Facilty/Provider(s): Dr. Wynelle Link Reason for Treatment: Schizophrenia, MDD Does patient have an ACCT team?: No Does patient have Intensive In-House Services?  : No Does patient have [facility] services? : No Does patient have P4CC services?: No  ADL Screening (condition at time of admission) Patient's cognitive ability adequate to safely complete daily activities?: Yes Is the patient deaf or have difficulty hearing?: No Does the patient have difficulty seeing, even when wearing glasses/contacts?: No Does the patient have difficulty concentrating, remembering, or making decisions?: No Patient able to express need for assistance with ADLs?: Yes Does the patient have difficulty dressing or bathing?: No Independently performs ADLs?: Yes (appropriate for developmental age) Does the patient have difficulty walking or climbing stairs?: No Weakness of Legs: None Weakness of Arms/Hands: None       Abuse/Neglect Assessment (Assessment to be complete while patient is alone)  Physical Abuse: Denies Verbal Abuse: Denies Sexual Abuse: Denies Exploitation of patient/patient's resources: Denies Self-Neglect: Denies     Merchant navy officer (For Healthcare) Does Patient Have a Medical Advance Directive?: No Would patient like information on  creating a medical advance directive?: No - Patient declined    Additional Information 1:1 In Past 12 Months?: No CIRT Risk: No Elopement Risk: No Does patient have medical clearance?: Yes     Disposition:  Disposition Initial Assessment Completed for this Encounter: Yes  Jarvis Knodel D 04/24/2016 7:36 AM

## 2016-04-24 NOTE — ED Notes (Addendum)
Pt on phone at nurses' desk talking w/friend. Pt noted to be talking in loud voice - quietened when asked. Pt then sat at nurses' desk and talked w/staff - noted to be very talkative. Pt returned to room as asked. Pt signed Medical Clearance Pt Policy form - verbalized understanding - copy given to pt and original placed on chart. Offered pt to shower x 2 - declined - states will later.

## 2016-04-24 NOTE — ED Notes (Signed)
Ovid Curduth, [facility], aware pt is en route.

## 2016-04-24 NOTE — ED Notes (Signed)
Pt asking for multiple requests in 1 item increments - Ginger Ale, blankets, phone numbers from chart, lights turned on then off. Pt attempted to make phone call at nurses' desk. Initially, refusing to use phone at desk d/t "I don't want other people hearing my conversation". RN advised pt this is the only phone she may use - pt complied - no one answered her call.

## 2016-04-24 NOTE — ED Notes (Signed)
[facility] [address] [facility] [facility] as requested.

## 2016-04-24 NOTE — Discharge Instructions (Signed)
Transfer to [facility] 

## 2016-04-24 NOTE — BH Assessment (Signed)
Pt's consult was in 04/23/16 at 1524 but Pt was too drowsy for assessment. Pt was seen by this writer today 05/04/16 0736.   Per Jacki ConesLaurie, NP Pt meets inpatient criteria. TTS to seek placement.   Wolfgang PhoenixBrandi Kiowa Hollar, [facility] Triage Specialist

## 2016-07-06 ENCOUNTER — Other Ambulatory Visit: Payer: Self-pay | Admitting: Internal Medicine

## 2016-07-06 DIAGNOSIS — Z1231 Encounter for screening mammogram for malignant neoplasm of breast: Secondary | ICD-10-CM

## 2016-07-21 ENCOUNTER — Ambulatory Visit: Payer: Medicare Other

## 2016-07-22 ENCOUNTER — Ambulatory Visit: Payer: Medicare Other

## 2016-08-12 ENCOUNTER — Ambulatory Visit: Payer: Medicare Other

## 2016-09-05 ENCOUNTER — Ambulatory Visit: Payer: Medicare Other

## 2016-09-19 ENCOUNTER — Encounter (HOSPITAL_BASED_OUTPATIENT_CLINIC_OR_DEPARTMENT_OTHER): Payer: Self-pay

## 2016-09-19 DIAGNOSIS — G4733 Obstructive sleep apnea (adult) (pediatric): Secondary | ICD-10-CM

## 2016-09-22 ENCOUNTER — Ambulatory Visit
Admission: RE | Admit: 2016-09-22 | Discharge: 2016-09-22 | Disposition: A | Discharge status: Home / Self Care | Payer: Medicare HMO | Source: Ambulatory Visit | Attending: Internal Medicine | Admitting: Internal Medicine

## 2016-09-22 DIAGNOSIS — Z1231 Encounter for screening mammogram for malignant neoplasm of breast: Secondary | ICD-10-CM

## 2016-10-20 ENCOUNTER — Ambulatory Visit (HOSPITAL_BASED_OUTPATIENT_CLINIC_OR_DEPARTMENT_OTHER): Payer: Medicare HMO

## 2016-10-20 ENCOUNTER — Encounter (HOSPITAL_COMMUNITY): Payer: Self-pay | Admitting: Emergency Medicine

## 2016-10-20 ENCOUNTER — Emergency Department (HOSPITAL_COMMUNITY)
Admission: EM | Admit: 2016-10-20 | Discharge: 2016-10-20 | Disposition: A | Discharge status: Home / Self Care | Payer: Medicare HMO | Attending: Emergency Medicine | Admitting: Emergency Medicine

## 2016-10-20 DIAGNOSIS — Z046 Encounter for general psychiatric examination, requested by authority: Secondary | ICD-10-CM | POA: Insufficient documentation

## 2016-10-20 DIAGNOSIS — F99 Mental disorder, not otherwise specified: Secondary | ICD-10-CM | POA: Insufficient documentation

## 2016-10-20 DIAGNOSIS — Z5321 Procedure and treatment not carried out due to patient leaving prior to being seen by health care provider: Secondary | ICD-10-CM | POA: Insufficient documentation

## 2016-10-20 LAB — COMPREHENSIVE METABOLIC PANEL
ALK PHOS: 52 U/L (ref 38–126)
ALT: 19 U/L (ref 14–54)
ANION GAP: 7 (ref 5–15)
AST: 20 U/L (ref 15–41)
Albumin: 3.5 g/dL (ref 3.5–5.0)
BILIRUBIN TOTAL: 0.4 mg/dL (ref 0.3–1.2)
BUN: 14 mg/dL (ref 6–20)
CALCIUM: 8.9 mg/dL (ref 8.9–10.3)
CO2: 25 mmol/L (ref 22–32)
Chloride: 102 mmol/L (ref 101–111)
Creatinine, Ser: 0.91 mg/dL (ref 0.44–1.00)
GFR calc Af Amer: >60 mL/min (ref >60)
Glucose, Bld: 130 mg/dL — ABNORMAL HIGH (ref 65–99)
POTASSIUM: 3.3 mmol/L — AB (ref 3.5–5.1)
Sodium: 134 mmol/L — ABNORMAL LOW (ref 135–145)
TOTAL PROTEIN: 7 g/dL (ref 6.5–8.1)

## 2016-10-20 LAB — SALICYLATE LEVEL: Salicylate Lvl: <7 mg/dL (ref 2.8–30.0)

## 2016-10-20 LAB — CBC
HCT: 37 % (ref 36.0–46.0)
Hemoglobin: 12.1 g/dL (ref 12.0–15.0)
MCH: 30.6 pg (ref 26.0–34.0)
MCHC: 32.7 g/dL (ref 30.0–36.0)
MCV: 93.7 fL (ref 78.0–100.0)
PLATELETS: 304 10*3/uL (ref 150–400)
RBC: 3.95 MIL/uL (ref 3.87–5.11)
RDW: 13.8 % (ref 11.5–15.5)
WBC: 8.1 10*3/uL (ref 4.0–10.5)

## 2016-10-20 LAB — ACETAMINOPHEN LEVEL

## 2016-10-20 LAB — ETHANOL

## 2016-10-20 NOTE — ED Notes (Signed)
Pt walked back in and would like to be seen

## 2016-10-20 NOTE — ED Triage Notes (Signed)
Pt placed in maroon scrubs and wanded by security. 

## 2016-10-20 NOTE — ED Notes (Addendum)
Attempted x 3 to triage patient. Pt stated [facility]. Pt refused to be triaged, "I will just walk up the hill and call EMS" Paramedic was with this RN. Let patient know that she would not be transported to [facility] because [facility]. Pt refused to be seen and walked up the hill.

## 2016-10-20 NOTE — ED Notes (Addendum)
Pt is pacing in the Waiting Room. Pt has been called for traige, but continues to be worried about a sleep study. Pt made [facility] that [facility] by multiple staff, but insists that it is here. Pt made aware that they will see her in triage, but she continues to pace in the waiting room.

## 2016-10-20 NOTE — ED Notes (Signed)
Attempted to call pt for room, pt no where to be found. Charge RN aware.

## 2016-10-20 NOTE — ED Triage Notes (Addendum)
Pt states "I seen somebody walking in a purple shirt then I looked again and no one was there. I also seen the doors open and close and they aint been doin that." Pt denies any HI/SI. Pt does state someone could "be trying to stage me. Watch me try to sleep. Granddaughter wouldn't have looked at me and cried and laughed like that. And then afterwhile, the missionary said it too."

## 2016-11-03 ENCOUNTER — Ambulatory Visit (HOSPITAL_BASED_OUTPATIENT_CLINIC_OR_DEPARTMENT_OTHER): Payer: Medicare HMO | Attending: Internal Medicine | Admitting: Internal Medicine

## 2016-11-03 VITALS — Ht 64.0 in | Wt 243.0 lb

## 2016-11-03 DIAGNOSIS — R0681 Apnea, not elsewhere classified: Secondary | ICD-10-CM | POA: Diagnosis present

## 2016-11-03 DIAGNOSIS — R0683 Snoring: Secondary | ICD-10-CM | POA: Diagnosis not present

## 2016-11-03 DIAGNOSIS — G4733 Obstructive sleep apnea (adult) (pediatric): Secondary | ICD-10-CM

## 2016-11-06 DIAGNOSIS — G4733 Obstructive sleep apnea (adult) (pediatric): Secondary | ICD-10-CM

## 2016-11-06 NOTE — Procedures (Signed)
   Patient Name: Rachel Alvarado, Nolf Date: 11/03/2016 Gender: Female D.O.B: October 23, 1965 Age (years): 59 Referring Provider: Salli Real Height (inches): 64 Interpreting Physician: Jetty Duhamel MD, ABSM Weight (lbs): 243 RPSGT: Lowry Ram BMI: 42 MRN: [id number] Neck Size: 16.50 CLINICAL INFORMATION Sleep Study Type: NPSG  Indication for sleep study: Obesity, OSA  Epworth Sleepiness Score: 13  SLEEP STUDY TECHNIQUE As per the AASM Manual for the Scoring of Sleep and Associated Events v2.3 (April 2016) with a hypopnea requiring 4% desaturations.  The channels recorded and monitored were frontal, central and occipital EEG, electrooculogram (EOG), submentalis EMG (chin), nasal and oral airflow, thoracic and abdominal wall motion, anterior tibialis EMG, snore microphone, electrocardiogram, and pulse oximetry.  MEDICATIONS Medications self-administered by patient taken the night of the study : SEROQUEL  SLEEP ARCHITECTURE The study was initiated at 10:55:26 PM and ended at 5:12:31 AM.  Sleep onset time was 1.8 minutes and the sleep efficiency was 91.1%. The total sleep time was 343.5 minutes.  Stage REM latency was 171.5 minutes.  The patient spent 2.91% of the night in stage N1 sleep, 80.49% in stage N2 sleep, 0.00% in stage N3 and 16.59% in REM.  Alpha intrusion was absent.  Supine sleep was 31.30%.  RESPIRATORY PARAMETERS The overall apnea/hypopnea index (AHI) was 3.8 per hour. There were 4 total apneas, including 4 obstructive, 0 central and 0 mixed apneas. There were 18 hypopneas and 22 RERAs.  The AHI during Stage REM sleep was 17.9 per hour.  AHI while supine was 0.6 per hour.  The mean oxygen saturation was 95.87%. The minimum SpO2 during sleep was 88.00%.  Moderate snoring was noted during this study.  CARDIAC DATA The 2 lead EKG demonstrated sinus rhythm. The mean heart rate was 86.88 beats per minute. Other EKG findings include: None.  LEG MOVEMENT  DATA The total PLMS were 108 with a resulting PLMS index of 18.86. Associated arousal with leg movement index was 0.0 .  IMPRESSIONS - No significant obstructive sleep apnea occurred during this study (AHI = 3.8/h). - No significant central sleep apnea occurred during this study (CAI = 0.0/h). - The patient had minimal or no oxygen desaturation during the study (Min O2 = 88.00%) - The patient snored with Moderate snoring volume. - No cardiac abnormalities were noted during this study. - Mild periodic limb movements of sleep occurred during the study. No significant associated arousals.  DIAGNOSIS - Primary Snoring (786.09 [R06.83 ICD-10])  RECOMMENDATIONS - Be careful with alcohol, sedatives and other CNS depressants that may worsen sleep apnea and disrupt normal sleep architecture. - Sleep hygiene should be reviewed to assess factors that may improve sleep quality. - Weight management and regular exercise should be initiated or continued if appropriate.  [Electronically signed] 11/06/2016 03:03 PM  Jetty Duhamel MD, ABSM Diplomate, American Board of Sleep Medicine   NPI: [id number]  Waymon Budge Diplomate, American Board of Sleep Medicine  ELECTRONICALLY SIGNED ON:  11/06/2016, 3:02 PM [facility] PH: [phone]   FX: [phone] ACCREDITED BY THE AMERICAN ACADEMY OF SLEEP MEDICINE

## 2017-03-31 ENCOUNTER — Encounter: Payer: Self-pay | Admitting: *Deleted

## 2017-03-31 ENCOUNTER — Ambulatory Visit: Payer: Medicare HMO | Admitting: Diagnostic Neuroimaging

## 2017-04-03 ENCOUNTER — Encounter: Payer: Self-pay | Admitting: Diagnostic Neuroimaging

## 2017-04-05 ENCOUNTER — Ambulatory Visit (INDEPENDENT_AMBULATORY_CARE_PROVIDER_SITE_OTHER): Payer: Medicare HMO | Admitting: Neurology

## 2017-04-05 ENCOUNTER — Encounter: Payer: Self-pay | Admitting: Neurology

## 2017-04-05 VITALS — BP 123/86 | HR 91 | Ht 65.0 in | Wt 257.0 lb

## 2017-04-05 DIAGNOSIS — M5440 Lumbago with sciatica, unspecified side: Secondary | ICD-10-CM | POA: Diagnosis not present

## 2017-04-05 DIAGNOSIS — G8929 Other chronic pain: Secondary | ICD-10-CM | POA: Insufficient documentation

## 2017-04-05 NOTE — Progress Notes (Signed)
PATIENT: Rachel Alvarado DOB: 1965-06-22  Chief Complaint  Patient presents with  . Numbness    Patient reports that she has 2 pinched nerves, sometimes she has numbness in her fingers.   Marland Kitchen PCP    Salli Real, MD      HISTORICAL  Rachel Alvarado is a 52 years old female, seen in refer by primary care doctor Salli Real, for evaluation of numbness, initial evaluation was on April 05, 2017.  I reviewed and summarized the referring note, she had a past medical history of cocaine abuse, COPD, obesity, hypertension, obstructive sleep apnea, hyperlipidemia, vitamin D deficiency,  She used to sit at the milk crate, which has put a lot of pressure on her buttock region, she also developed mild low back pain, gradually getting worse since 2014, she developed radiating pain from lower back to bilateral hip, radiating pain to bilateral lower extremity, bilateral feet numbness tingling, also intermittent bilateral hands paresthesia,  She has used a walker for long distance, no wheelchair occasionally, she has bladder stress incontinence, no bowel incontinence.  REVIEW OF SYSTEMS: Full 14 system review of systems performed and notable only for weight gain, fatigue, snoring, incontinence, urination problem, feeling hot, cold, joint pain, allergy, female, snoring, depression, anxiety, decreased energy, disinterested in activities, racing thoughts.  ALLERGIES: Allergies  Allergen Reactions  . Sulfa Antibiotics Hives    HOME MEDICATIONS: Current Outpatient Medications  Medication Sig Dispense Refill  . aspirin 81 MG EC tablet Take 81 mg by mouth daily. Swallow whole.    . belladonna-opium (B&O SUPPRETTES) 16.2-30 MG suppository Place 1 suppository rectally every 8 (eight) hours as needed for pain. 12 suppository 0  . Cholecalciferol (VITAMIN D-3) 5000 units TABS Take 5,000 Units by mouth daily.    Marland Kitchen docusate sodium (COLACE) 100 MG capsule Take 200 mg by mouth daily.    Marland Kitchen losartan (COZAAR) 100 MG  tablet Take 100 mg by mouth daily.    . Multiple Vitamin (MULTIVITAMIN WITH MINERALS) TABS tablet Take 1 tablet by mouth daily.    . polyethylene glycol (MIRALAX / GLYCOLAX) packet Take 17 g by mouth daily. (Patient taking differently: Take 17 g by mouth daily as needed for mild constipation. ) 14 each 0  . QUEtiapine (SEROQUEL) 400 MG tablet Take 400 mg by mouth at bedtime.    . senna-docusate (SENOKOT-S) 8.6-50 MG tablet Take 1 tablet by mouth daily. (Patient taking differently: Take 1 tablet by mouth at bedtime as needed for mild constipation. ) 14 tablet 0   No current facility-administered medications for this visit.     PAST MEDICAL HISTORY: Past Medical History:  Diagnosis Date  . Anemia   . Anxiety   . Arthritis   . Chronic back pain   . COPD (chronic obstructive pulmonary disease) (HCC)   . Depression   . Hypertension   . Panic attacks   . Schizophrenia (HCC)   . Sleep apnea     PAST SURGICAL HISTORY: Past Surgical History:  Procedure Laterality Date  . CESAREAN SECTION    . CHOLECYSTECTOMY    . PARTIAL HYSTERECTOMY      FAMILY HISTORY: Family History  Problem Relation Age of Onset  . Alcohol abuse Father   . Heart disease Father   . Diabetes Mellitus II Father   . Diabetes Father   . Hypertension Father   . Osteoporosis Mother   . Heart disease Mother   . Diabetes Mellitus II Brother   . Stroke Sister  SOCIAL HISTORY:  Social History   Socioeconomic History  . Marital status: Married    Spouse name: Not on file  . Number of children: Not on file  . Years of education: Not on file  . Highest education level: Not on file  Social Needs  . Financial resource strain: Not on file  . Food insecurity - worry: Not on file  . Food insecurity - inability: Not on file  . Transportation needs - medical: Not on file  . Transportation needs - non-medical: Not on file  Occupational History  . Not on file  Tobacco Use  . Smoking status: Former Smoker     Packs/day: 0.02    Types: Cigarettes    Last attempt to quit: 02/03/2012    Years since quitting: 5.1  . Smokeless tobacco: Never Used  Substance and Sexual Activity  . Alcohol use: No    Alcohol/week: 0.0 oz  . Drug use: No    Comment: cocaine in a "blue moon, marijuana twice a wk", pt states she stopped using last month  . Sexual activity: No  Other Topics Concern  . Not on file  Social History Narrative  . Not on file     PHYSICAL EXAM   Vitals:   04/05/17 1358  BP: 123/86  Pulse: 91  Weight: 257 lb (116.6 kg)  Height: 5\' 5"  (1.651 m)    Not recorded      Body mass index is 42.77 kg/m.  PHYSICAL EXAMNIATION:  Gen: NAD, conversant, well nourised, obese, well groomed                     Cardiovascular: Regular rate rhythm, no peripheral edema, warm, nontender. Eyes: Conjunctivae clear without exudates or hemorrhage Neck: Supple, no carotid bruits. Pulmonary: Clear to auscultation bilaterally   NEUROLOGICAL EXAM:  MENTAL STATUS: Speech:    Speech is normal; fluent and spontaneous with normal comprehension.  Cognition:     Orientation to time, place and person     Normal recent and remote memory     Normal Attention span and concentration     Normal Language, naming, repeating,spontaneous speech     Fund of knowledge   CRANIAL NERVES: CN II: Visual fields are full to confrontation. Fundoscopic exam is normal with sharp discs and no vascular changes. Pupils are round equal and briskly reactive to light. CN III, IV, VI: extraocular movement are normal. No ptosis. CN V: Facial sensation is intact to pinprick in all 3 divisions bilaterally. Corneal responses are intact.  CN VII: Face is symmetric with normal eye closure and smile. CN VIII: Hearing is normal to rubbing fingers CN IX, X: Palate elevates symmetrically. Phonation is normal. CN XI: Head turning and shoulder shrug are intact CN XII: Tongue is midline with normal movements and no  atrophy.  MOTOR: There is no pronator drift of out-stretched arms. Muscle bulk and tone are normal. Muscle strength is normal.  REFLEXES: Reflexes are 2+ and symmetric at the biceps, triceps, knees, and ankles. Plantar responses are flexor.  SENSORY: Intact to light touch, pinprick, positional sensation and vibratory sensation are intact in fingers and toes.  COORDINATION: Rapid alternating movements and fine finger movements are intact. There is no dysmetria on finger-to-nose and heel-knee-shin.    GAIT/STANCE: She needs pushed up to get up from seated position, unsteady, cautious   DIAGNOSTIC DATA (LABS, IMAGING, TESTING) - I reviewed patient records, labs, notes, testing and imaging myself where available.   ASSESSMENT  AND PLAN  [place]an[place] LewisSharon W Moro is a 52 y.o. female   Bilateral lower extremity radiating pain  Consistent with bilateral lumbosacral radiculopathy vs musculoskeletal etiology, peripheral neuropathy  Proceed with MRI of lumbar  EMG nerve conduction study  NSAIDs prn   Levert FeinsteinYijun Osceola Holian, M.D. Ph.D.  [facility] [address], [address] [place], [place] [address] Ph: [phone] Fax: [phone]  CC: Salli RealSun, Yun, MD

## 2017-04-24 ENCOUNTER — Other Ambulatory Visit: Payer: Self-pay

## 2017-04-28 ENCOUNTER — Encounter (INDEPENDENT_AMBULATORY_CARE_PROVIDER_SITE_OTHER): Payer: Medicare HMO

## 2017-04-28 ENCOUNTER — Ambulatory Visit (INDEPENDENT_AMBULATORY_CARE_PROVIDER_SITE_OTHER): Payer: Medicare HMO | Admitting: Neurology

## 2017-04-28 DIAGNOSIS — M545 Low back pain: Secondary | ICD-10-CM | POA: Diagnosis not present

## 2017-04-28 DIAGNOSIS — Z0289 Encounter for other administrative examinations: Secondary | ICD-10-CM

## 2017-04-28 DIAGNOSIS — G8929 Other chronic pain: Secondary | ICD-10-CM

## 2017-04-28 DIAGNOSIS — M5440 Lumbago with sciatica, unspecified side: Principal | ICD-10-CM

## 2017-04-28 NOTE — Procedures (Signed)
Full Name: Rachel Alvarado Gender: Female MRN #: [id number] Date of Birth: [birth year]    Visit Date: 04/28/17 10:04 Age: [age] Years 9 Months Old Examining Physician: Levert FeinsteinYijun Damira Kem, MD  Referring Physician: Terrace ArabiaYan, MD History: 52 years old female with history of chronic low back pain, right-sided radiating pain.  Summary of the tests: Nerve conduction study: Bilateral sural, superficial peroneal sensory responses were normal.  Bilateral peroneal EDB, tibial motor responses were normal.  Right median, ulnar sensory responses are normal.  Right median motor response was normal.  Electromyography: Subjective examinations of right lower extremity muscles and right lumbar paraspinal muscles were normal.   Conclusion: This is a normal study.  There is no electrodiagnostic evidence of large fiber peripheral neuropathy or right lumbosacral radiculopathy.    ------------------------------- Levert FeinsteinYIjun [name], M.D.  [facility] [address] [place], [place] [address] Tel: [phone] Fax: [phone]        [facility]    Nerve / Sites Muscle Latency Ref. Amplitude Ref. Rel Amp Segments Distance Velocity Ref. Area    ms ms mV mV %  cm m/s m/s mVms  R Median - APB     Wrist APB 3.6 ?4.4 8.3 ?4.0 100 Wrist - APB 7   28.5     Upper arm APB 6.9  7.8  93.8 Upper arm - Wrist 19 58 ?49 25.6  R Peroneal - EDB     Ankle EDB 3.4 ?6.5 6.3 ?2.0 100 Ankle - EDB 9   18.0     Fib head EDB 9.3  6.0  94.6 Fib head - Ankle 32 55 ?44 17.0     Pop fossa EDB 11.3  6.1  102 Pop fossa - Fib head 10 49 ?44 17.2         Pop fossa - Ankle      L Peroneal - EDB     Ankle EDB 3.2 ?6.5 7.9 ?2.0 100 Ankle - EDB 9   19.3     Fib head EDB 9.0  7.1  90.4 Fib head - Ankle 32 55 ?44 18.5     Pop fossa EDB 11.0  6.8  96 Pop fossa - Fib head 10 49 ?44 17.9         Pop fossa - Ankle      R Tibial - AH     Ankle AH 3.4 ?5.8 4.4 ?4.0 100 Ankle - AH 9   11.9     Pop fossa AH 10.8  3.4  77.1 Pop fossa - Ankle 33 45  ?41 8.2  L Tibial - AH     Ankle AH 3.8 ?5.8 5.3 ?4.0 100 Ankle - AH 9   11.3     Pop fossa AH 11.6  4.2  78.1 Pop fossa - Ankle 33 43 ?41 10.7               SNC    Nerve / Sites Rec. Site Peak Lat Ref.  Amp Ref. Segments Distance    ms ms V V  cm  R Sural - Ankle (Calf)     Calf Ankle 3.5 ?4.4 7 ?6 Calf - Ankle 14  L Sural - Ankle (Calf)     Calf Ankle 3.4 ?4.4 6 ?6 Calf - Ankle 14  R Superficial peroneal - Ankle     Lat leg Ankle 3.8 ?4.4 6 ?6 Lat leg - Ankle 14  L Superficial peroneal - Ankle     Lat leg  Ankle 4.0 ?4.4 7 ?6 Lat leg - Ankle 14  R Median - Orthodromic (Dig II, Mid palm)     Dig II Wrist 2.9 ?3.4 17 ?10 Dig II - Wrist 13  R Ulnar - Orthodromic, (Dig V, Mid palm)     Dig V Wrist 2.7 ?3.1 5 ?5 Dig V - Wrist 43                  F  Wave    Nerve F Lat Ref.   ms ms  R Tibial - AH 47.0 ?56.0  L Tibial - AH 47.3 ?56.0         EMG full       EMG Summary Table    Spontaneous MUAP Recruitment  Muscle IA Fib PSW Fasc Other Amp Dur. Poly Pattern  R. Tibialis posterior Normal None None None _______ Normal Normal Normal Normal  R. Tibialis anterior Normal None None None _______ Normal Normal Normal Normal  R. Peroneus longus Normal None None None _______ Normal Normal Normal Normal  R. Vastus lateralis Normal None None None _______ Normal Normal Normal Normal  R. Biceps femoris (short head) Normal None None None _______ Normal Normal Normal Normal  R. Lumbar paraspinals (mid) Normal None None None _______ Normal Normal Normal Normal  R. Lumbar paraspinals (low) Normal None None None _______ Normal Normal Normal Normal

## 2017-04-29 ENCOUNTER — Ambulatory Visit
Admission: RE | Admit: 2017-04-29 | Discharge: 2017-04-29 | Disposition: A | Discharge status: Home / Self Care | Payer: Medicare HMO | Source: Ambulatory Visit | Attending: Neurology | Admitting: Neurology

## 2017-04-29 DIAGNOSIS — G8929 Other chronic pain: Secondary | ICD-10-CM

## 2017-04-29 DIAGNOSIS — M5440 Lumbago with sciatica, unspecified side: Secondary | ICD-10-CM

## 2017-05-01 ENCOUNTER — Telehealth: Payer: Self-pay | Admitting: Neurology

## 2017-05-01 NOTE — Telephone Encounter (Signed)
Please call patient, I have reviewed MRI of lumbar, there is evidence of prominent spondylitic changes at L5-S1, there is evidence of moderate bilateral foraminal narrowing, but with her normal EMG nerve conduction study, I do not think she need to have any surgical intervention at this point,  She would benefit physical therapy, moderate exercise, as needed NSAIDs,    IMPRESSION:  Abnormal MRI scan lumbar spine showing prominent spondylitic change at L5-S1 where there is broad-based central disc osteophyte protrusion and facet hypertrophy resulting in moderate bilateral foraminal narrowing.

## 2017-05-01 NOTE — Telephone Encounter (Signed)
Spoke to patient - she is aware of results and recommendations below.  Offered referral for PT but she would like to call our office back.

## 2017-08-19 ENCOUNTER — Encounter (HOSPITAL_COMMUNITY): Payer: Self-pay | Admitting: Emergency Medicine

## 2017-08-19 ENCOUNTER — Emergency Department (HOSPITAL_COMMUNITY)
Admission: EM | Admit: 2017-08-19 | Discharge: 2017-08-20 | Disposition: A | Discharge status: Home / Self Care | Payer: Medicare HMO | Attending: Emergency Medicine | Admitting: Emergency Medicine

## 2017-08-19 ENCOUNTER — Other Ambulatory Visit: Payer: Self-pay

## 2017-08-19 ENCOUNTER — Emergency Department (HOSPITAL_COMMUNITY): Payer: Medicare HMO

## 2017-08-19 DIAGNOSIS — F1910 Other psychoactive substance abuse, uncomplicated: Secondary | ICD-10-CM

## 2017-08-19 DIAGNOSIS — Z79899 Other long term (current) drug therapy: Secondary | ICD-10-CM | POA: Diagnosis not present

## 2017-08-19 DIAGNOSIS — Z87891 Personal history of nicotine dependence: Secondary | ICD-10-CM | POA: Insufficient documentation

## 2017-08-19 DIAGNOSIS — J449 Chronic obstructive pulmonary disease, unspecified: Secondary | ICD-10-CM | POA: Diagnosis not present

## 2017-08-19 DIAGNOSIS — Z7982 Long term (current) use of aspirin: Secondary | ICD-10-CM | POA: Insufficient documentation

## 2017-08-19 DIAGNOSIS — I10 Essential (primary) hypertension: Secondary | ICD-10-CM | POA: Insufficient documentation

## 2017-08-19 LAB — CBG MONITORING, ED: Glucose-Capillary: 147 mg/dL — ABNORMAL HIGH (ref 65–99)

## 2017-08-19 LAB — CBC
HCT: 37.1 % (ref 36.0–46.0)
Hemoglobin: 12.2 g/dL (ref 12.0–15.0)
MCH: 30.8 pg (ref 26.0–34.0)
MCHC: 32.9 g/dL (ref 30.0–36.0)
MCV: 93.7 fL (ref 78.0–100.0)
PLATELETS: 288 10*3/uL (ref 150–400)
RBC: 3.96 MIL/uL (ref 3.87–5.11)
RDW: 12.9 % (ref 11.5–15.5)
WBC: 8.1 10*3/uL (ref 4.0–10.5)

## 2017-08-19 LAB — BASIC METABOLIC PANEL
Anion gap: 10 (ref 5–15)
BUN: 11 mg/dL (ref 6–20)
CO2: 23 mmol/L (ref 22–32)
CREATININE: 0.91 mg/dL (ref 0.44–1.00)
Calcium: 8.8 mg/dL — ABNORMAL LOW (ref 8.9–10.3)
Chloride: 103 mmol/L (ref 101–111)
GFR calc Af Amer: >60 mL/min (ref >60)
GFR calc non Af Amer: >60 mL/min (ref >60)
GLUCOSE: 168 mg/dL — AB (ref 65–99)
Potassium: 3.4 mmol/L — ABNORMAL LOW (ref 3.5–5.1)
SODIUM: 136 mmol/L (ref 135–145)

## 2017-08-19 LAB — SALICYLATE LEVEL

## 2017-08-19 LAB — I-STAT TROPONIN, ED: Troponin i, poc: 0 ng/mL (ref 0.00–0.08)

## 2017-08-19 LAB — I-STAT BETA HCG BLOOD, ED (MC, WL, AP ONLY): I-stat hCG, quantitative: <5 m[IU]/mL (ref <5)

## 2017-08-19 LAB — ACETAMINOPHEN LEVEL

## 2017-08-19 LAB — ETHANOL: Alcohol, Ethyl (B): <10 mg/dL (ref <10)

## 2017-08-19 MED ORDER — ALBUTEROL SULFATE (2.5 MG/3ML) 0.083% IN NEBU
5.0000 mg | INHALATION_SOLUTION | Freq: Once | RESPIRATORY_TRACT | Status: DC
  Filled 2017-08-19: qty 6

## 2017-08-19 MED ORDER — ONDANSETRON 4 MG PO TBDP
4.0000 mg | ORAL_TABLET | Freq: Once | ORAL | Status: AC
  Administered 2017-08-19: 4 mg via ORAL
  Filled 2017-08-19: qty 1

## 2017-08-19 NOTE — ED Triage Notes (Signed)
Pt stated "I messed up, I've been using too much crack. Drinking too much and using to much cigarettes."  Pt very anxious in triage.  Very anxious.

## 2017-08-19 NOTE — ED Triage Notes (Signed)
Pt reports SOB since last night "when I try to go to sleep".  She states she can't lay down.  Denies N/V/diaharria.  Pt main complaint is that she has not gotten any sleep in two days.  Pt reports she has anxiety "very, very bad."

## 2017-08-20 NOTE — ED Notes (Signed)
Pt refusing to let this writer assess her.  Pt is verbally abusive.

## 2017-08-20 NOTE — ED Notes (Signed)
Pt is aware of need for urine specimen. 

## 2017-08-20 NOTE — ED Provider Notes (Signed)
[facility] EMERGENCY DEPARTMENT Provider Note   CSN: [id number] Arrival date & time: 08/19/17  1908     History   Chief Complaint Chief Complaint  Patient presents with  . Shortness of Breath    HPI Rachel Alvarado is a 52 y.o. female.  Patient presents to the ER stating that she has having trouble sleeping.  She reports that she has a lot on her mind and has started drinking alcohol and using crack cocaine again.  Her son has become angry with her because she is using drugs again and she does not think he will let her back in his house.  She reports severe anxiety.  She is not homicidal or suicidal.  She reports that when she feels very anxious she feels short of breath.  Since being here in the ER, however, she has calm down and feels better.  She is not currently short of breath, has not had any chest pain.     Past Medical History:  Diagnosis Date  . Anemia   . Anxiety   . Arthritis   . Chronic back pain   . COPD (chronic obstructive pulmonary disease) (HCC)   . Depression   . Hypertension   . Panic attacks   . Schizophrenia (HCC)   . Sleep apnea     Patient Active Problem List   Diagnosis Date Noted  . 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 Bilateral low back pain without sciatica 07/02/2015  . Knee pain, bilateral 07/02/2015  . Breast pain, right 11/05/2013  . Dizziness and giddiness 05/28/2012  . Screening for STDs (sexually transmitted diseases) 04/24/2012  . Schizophrenia (HCC) 04/03/2012  . History of sleep apnea 04/03/2012  . Arthritis of both knees 04/03/2012  . Anxiety and depression 04/03/2012  . Musculoskeletal chest pain 04/03/2012  . Preventative health care 04/03/2012  . Morbid obesity with BMI of 45.0-49.9, adult (HCC) 04/03/2012    Past Surgical History:  Procedure Laterality Date  . CESAREAN SECTION    . CHOLECYSTECTOMY    . PARTIAL HYSTERECTOMY       OB History   None      Home Medications     Prior to Admission medications   Medication Sig Start Date End Date Taking? Authorizing Provider  aspirin 81 MG EC tablet Take 81 mg by mouth daily. Swallow whole.    [provider]  belladonna-opium (B&O SUPPRETTES) 16.2-30 MG suppository Place 1 suppository rectally every 8 (eight) hours as needed for pain. 04/16/16   Gerhard Munch, MD  Cholecalciferol (VITAMIN D-3) 5000 units TABS Take 5,000 Units by mouth daily.    [provider]  docusate sodium (COLACE) 100 MG capsule Take 200 mg by mouth daily.    [provider]  losartan (COZAAR) 100 MG tablet Take 100 mg by mouth daily.    [provider]  Multiple Vitamin (MULTIVITAMIN WITH MINERALS) TABS tablet Take 1 tablet by mouth daily.    [provider]  polyethylene glycol (MIRALAX / GLYCOLAX) packet Take 17 g by mouth daily. Patient taking differently: Take 17 g by mouth daily as needed for mild constipation.  04/16/16   Gerhard Munch, MD  QUEtiapine (SEROQUEL) 400 MG tablet Take 400 mg by mouth at bedtime.    [provider]  senna-docusate (SENOKOT-S) 8.6-50 MG tablet Take 1 tablet by mouth daily. Patient taking differently: Take 1 tablet by mouth at bedtime as needed for mild constipation.  04/16/16  Gerhard Munch, MD    Family History Family History  Problem Relation Age of Onset  . Alcohol abuse Father   . Heart disease Father   . Diabetes Mellitus II Father   . Diabetes Father   . Hypertension Father   . Osteoporosis Mother   . Heart disease Mother   . Diabetes Mellitus II Brother   . Stroke Sister     Social History Social History   Tobacco Use  . Smoking status: Former Smoker    Packs/day: 0.02    Types: Cigarettes    Last attempt to quit: 02/03/2012    Years since quitting: 5.5  . Smokeless tobacco: Never Used  Substance Use Topics  . Alcohol use: No    Alcohol/week: 0.0 oz  . Drug use: No    Types: Cocaine, Marijuana    Comment: cocaine in a  "blue moon, marijuana twice a wk", pt states she stopped using last month     Allergies   Sulfa antibiotics   Review of Systems Review of Systems  Psychiatric/Behavioral: Positive for sleep disturbance. The patient is nervous/anxious.   All other systems reviewed and are negative.    Physical Exam Updated Vital Signs BP 115/68 (BP Location: Right Arm)   Pulse 92   Temp 98.5 F (36.9 C) (Oral)   Resp 15   Ht 5\' 5"  (1.651 m)   Wt 117.9 kg (260 lb)   SpO2 100%   BMI 43.27 kg/m   Physical Exam  Constitutional: She is oriented to person, place, and time. She appears well-developed and well-nourished. No distress.  HENT:  Head: Normocephalic and atraumatic.  Right Ear: Hearing normal.  Left Ear: Hearing normal.  Nose: Nose normal.  Mouth/Throat: Oropharynx is clear and moist and mucous membranes are normal.  Eyes: Pupils are equal, round, and reactive to light. Conjunctivae and EOM are normal.  Neck: Normal range of motion. Neck supple.  Cardiovascular: Regular rhythm, S1 normal and S2 normal. Exam reveals no gallop and no friction rub.  No murmur heard. Pulmonary/Chest: Effort normal and breath sounds normal. No respiratory distress. She exhibits no tenderness.  Abdominal: Soft. Normal appearance and bowel sounds are normal. There is no hepatosplenomegaly. There is no tenderness. There is no rebound, no guarding, no tenderness at McBurney's point and negative Murphy's sign. No hernia.  Musculoskeletal: Normal range of motion.  Neurological: She is alert and oriented to person, place, and time. She has normal strength. No cranial nerve deficit or sensory deficit. Coordination normal. GCS eye subscore is 4. GCS verbal subscore is 5. GCS motor subscore is 6.  Skin: Skin is warm, dry and intact. No rash noted. No cyanosis.  Psychiatric: She has a normal mood and affect. Her speech is normal and behavior is normal. Thought content normal.  Nursing note and vitals  reviewed.    ED Treatments / Results  Labs (all labs ordered are listed, but only abnormal results are displayed) Labs Reviewed  BASIC METABOLIC PANEL - Abnormal; Notable for the following components:      Result Value   Potassium 3.4 (*)    Glucose, Bld 168 (*)    Calcium 8.8 (*)    All other components within normal limits  ACETAMINOPHEN LEVEL - Abnormal; Notable for the following components:   Acetaminophen (Tylenol), Serum <10 (*)    All other components within normal limits  CBG MONITORING, ED - Abnormal; Notable for the following components:   Glucose-Capillary 147 (*)    All other components  within normal limits  CBC  ETHANOL  SALICYLATE LEVEL  RAPID URINE DRUG SCREEN, HOSP PERFORMED  I-STAT TROPONIN, ED  I-STAT BETA HCG BLOOD, ED ([facility], [facility], [facility] ONLY)    EKG EKG Interpretation  Date/Time:  Saturday August 19 2017 19:20:43 EDT Ventricular Rate:  118 PR Interval:  140 QRS Duration: 80 QT Interval:  304 QTC Calculation: 426 R Axis:   33 Text Interpretation:  Sinus tachycardia Cannot rule out Anterior infarct , age undetermined Abnormal ECG When compared with ECG of 04/23/2016, No significant change was found Confirmed by Dione BoozeGlick, David ([id number]) on 08/19/2017 10:52:04 PM   Radiology Dg Chest 2 View  Result Date: 08/19/2017 CLINICAL DATA:  52 year old with complaints of shortness of breath when lying down. Anxiety. Current history of sleep apnea. Former smoker. EXAM: CHEST - 2 VIEW COMPARISON:  06/26/2015, 11/04/2014 and earlier. FINDINGS: AP ERECT and LATERAL images were obtained. Suboptimal inspiration accounts for crowded bronchovascular markings, especially in the bases, and accentuates the cardiac silhouette. Taking this into account, cardiac silhouette normal in size, unchanged. Hilar and mediastinal contours unremarkable. Mild central peribronchial thickening, more so than on the prior examinations. Lungs otherwise clear. No localized airspace consolidation. No pleural  effusions. No pneumothorax. Normal pulmonary vascularity. Degenerative changes and DISH involving the thoracic spine. IMPRESSION: Suboptimal inspiration. Mild changes of acute bronchitis and/or asthma without focal airspace pneumonia. Electronically Signed   By: Hulan Saashomas  Lawrence M.D.   On: 08/19/2017 20:08    Procedures Procedures (including critical care time)  Medications Ordered in ED Medications  albuterol (PROVENTIL) (2.5 MG/3ML) 0.083% nebulizer solution 5 mg (has no administration in time range)  ondansetron (ZOFRAN-ODT) disintegrating tablet 4 mg (4 mg Oral Given 08/19/17 1944)     Initial Impression / Assessment and Plan / ED Course  I have reviewed the triage vital signs and the nursing notes.  Pertinent labs & imaging results that were available during my care of the patient were reviewed by me and considered in my medical decision making (see chart for details).     Patient reports that her symptoms of anxiety and shortness of breath have resolved.  She is back to her normal baseline when I examined her.  She appears awake, alert and unimpaired.  Her work-up has been negative.  She became agitated because she was not given water and became belligerent.  She had been asking to speak to a mental health professional to help her with her polysubstance abuse, but when she became combative towards staff, she was discharged.  She had been evaluated and she was not homicidal or suicidal, felt safe for outpatient follow-up.  Final Clinical Impressions(s) / ED Diagnoses   Final diagnoses:  Polysubstance abuse [facility](HCC)    ED Discharge Orders    None       Pollina, Canary Brimhristopher J, MD 08/20/17 0246

## 2017-08-20 NOTE — ED Notes (Signed)
Pt upset about not receiving water, offered water or other beverages and refused. Tech offered water/beverages pt refused from tech and stated that she would wait "until the other lady came back". Pt continued to refused offers of water and beverages.

## 2017-08-20 NOTE — ED Notes (Signed)
At this time, pt escalating and demanding to speak to a physician. Pt w/ yelling and stating "we aint done shit for her". Explained course of care to pt. MD Pollina notified of pt behavior, Md advised pt is ready for discharge at this time. Pt denies SI/HI to this Clinical research associatewriter and other staff members. Pt advised she is ready for discharge security contacted to escort pt off premises. Security to bedside, [employer] officer at bedside as well. Pt continues to escalate, refusing to leave at this time, stating we cannot discharge her. MD Pollina updated. At this time, pt called 911 to "document what is happening to her". [employer] officer at bedside informed pt that she could voice complaint to him, but she stated "I don't want to talk to him he going along with them." Pt remained on 911 while being escorted out to waiting room. Security advised pt must be removed from premises and is not able to wait in lobby due to behavior.

## 2017-08-30 ENCOUNTER — Other Ambulatory Visit: Payer: Self-pay | Admitting: Internal Medicine

## 2017-08-30 DIAGNOSIS — Z1231 Encounter for screening mammogram for malignant neoplasm of breast: Secondary | ICD-10-CM

## 2017-10-30 ENCOUNTER — Ambulatory Visit: Payer: Self-pay

## 2018-02-06 ENCOUNTER — Ambulatory Visit: Payer: Self-pay

## 2018-02-11 ENCOUNTER — Emergency Department (HOSPITAL_COMMUNITY)
Admission: EM | Admit: 2018-02-11 | Discharge: 2018-02-11 | Disposition: A | Discharge status: Home / Self Care | Payer: Medicare HMO | Attending: Emergency Medicine | Admitting: Emergency Medicine

## 2018-02-11 ENCOUNTER — Encounter (HOSPITAL_COMMUNITY): Payer: Self-pay

## 2018-02-11 DIAGNOSIS — Z7982 Long term (current) use of aspirin: Secondary | ICD-10-CM | POA: Insufficient documentation

## 2018-02-11 DIAGNOSIS — M545 Low back pain, unspecified: Secondary | ICD-10-CM

## 2018-02-11 DIAGNOSIS — Z87891 Personal history of nicotine dependence: Secondary | ICD-10-CM | POA: Diagnosis not present

## 2018-02-11 DIAGNOSIS — I10 Essential (primary) hypertension: Secondary | ICD-10-CM | POA: Insufficient documentation

## 2018-02-11 DIAGNOSIS — Z79899 Other long term (current) drug therapy: Secondary | ICD-10-CM | POA: Insufficient documentation

## 2018-02-11 DIAGNOSIS — J449 Chronic obstructive pulmonary disease, unspecified: Secondary | ICD-10-CM | POA: Insufficient documentation

## 2018-02-11 DIAGNOSIS — G8929 Other chronic pain: Secondary | ICD-10-CM

## 2018-02-11 MED ORDER — IBUPROFEN 800 MG PO TABS
800.0000 mg | ORAL_TABLET | Freq: Once | ORAL | Status: AC
  Administered 2018-02-11: 800 mg via ORAL
  Filled 2018-02-11: qty 1

## 2018-02-11 MED ORDER — QUETIAPINE FUMARATE 200 MG PO TABS
400.0000 mg | ORAL_TABLET | Freq: Every day | ORAL | Status: DC
  Administered 2018-02-11: 400 mg via ORAL
  Filled 2018-02-11: qty 2

## 2018-02-11 MED ORDER — IBUPROFEN 800 MG PO TABS: 800.0000 mg | ORAL_TABLET | Freq: Three times a day (TID) | ORAL | 0 refills | Status: AC

## 2018-02-11 NOTE — ED Notes (Signed)
Pt stable, ambulatory, states understanding of discharge instructions 

## 2018-02-11 NOTE — ED Triage Notes (Signed)
[place] [facility] [place] and knee pain
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
## 2018-03-27 ENCOUNTER — Other Ambulatory Visit: Payer: Self-pay | Admitting: Internal Medicine

## 2018-03-27 DIAGNOSIS — E2839 Other primary ovarian failure: Secondary | ICD-10-CM

## 2018-04-04 ENCOUNTER — Ambulatory Visit
Admission: RE | Admit: 2018-04-04 | Discharge: 2018-04-04 | Disposition: A | Discharge status: Home / Self Care | Payer: Medicare HMO | Source: Ambulatory Visit | Attending: Internal Medicine | Admitting: Internal Medicine

## 2018-04-04 DIAGNOSIS — Z1231 Encounter for screening mammogram for malignant neoplasm of breast: Secondary | ICD-10-CM

## 2018-04-04 DIAGNOSIS — E2839 Other primary ovarian failure: Secondary | ICD-10-CM

## 2018-06-24 ENCOUNTER — Encounter (HOSPITAL_COMMUNITY): Payer: Self-pay

## 2018-06-24 ENCOUNTER — Other Ambulatory Visit: Payer: Self-pay

## 2018-06-24 ENCOUNTER — Emergency Department (HOSPITAL_COMMUNITY): Payer: Medicare HMO

## 2018-06-24 ENCOUNTER — Inpatient Hospital Stay (HOSPITAL_COMMUNITY)
Admission: AD | Admit: 2018-06-24 | Discharge: 2018-06-26 | DRG: 885 | Disposition: A | Discharge status: Home / Self Care | Payer: Medicare HMO | Source: Intra-hospital | Attending: Psychiatry | Admitting: Psychiatry

## 2018-06-24 ENCOUNTER — Emergency Department (HOSPITAL_COMMUNITY)
Admission: EM | Admit: 2018-06-24 | Discharge: 2018-06-24 | Disposition: A | Discharge status: Other Acute Inpatient Hospital | Payer: Medicare HMO | Attending: Emergency Medicine | Admitting: Emergency Medicine

## 2018-06-24 DIAGNOSIS — R441 Visual hallucinations: Secondary | ICD-10-CM | POA: Insufficient documentation

## 2018-06-24 DIAGNOSIS — F15950 Other stimulant use, unspecified with stimulant-induced psychotic disorder with delusions: Secondary | ICD-10-CM

## 2018-06-24 DIAGNOSIS — Z046 Encounter for general psychiatric examination, requested by authority: Secondary | ICD-10-CM | POA: Diagnosis not present

## 2018-06-24 DIAGNOSIS — F22 Delusional disorders: Secondary | ICD-10-CM | POA: Diagnosis not present

## 2018-06-24 DIAGNOSIS — F14259 Cocaine dependence with cocaine-induced psychotic disorder, unspecified: Secondary | ICD-10-CM

## 2018-06-24 DIAGNOSIS — R440 Auditory hallucinations: Secondary | ICD-10-CM | POA: Diagnosis not present

## 2018-06-24 DIAGNOSIS — Z90711 Acquired absence of uterus with remaining cervical stump: Secondary | ICD-10-CM

## 2018-06-24 DIAGNOSIS — F14159 Cocaine abuse with cocaine-induced psychotic disorder, unspecified: Secondary | ICD-10-CM | POA: Diagnosis present

## 2018-06-24 DIAGNOSIS — G4730 Sleep apnea, unspecified: Secondary | ICD-10-CM | POA: Diagnosis present

## 2018-06-24 DIAGNOSIS — F1410 Cocaine abuse, uncomplicated: Secondary | ICD-10-CM | POA: Diagnosis not present

## 2018-06-24 DIAGNOSIS — F250 Schizoaffective disorder, bipolar type: Principal | ICD-10-CM

## 2018-06-24 DIAGNOSIS — Z79899 Other long term (current) drug therapy: Secondary | ICD-10-CM | POA: Diagnosis not present

## 2018-06-24 DIAGNOSIS — Z915 Personal history of self-harm: Secondary | ICD-10-CM | POA: Diagnosis not present

## 2018-06-24 DIAGNOSIS — Z833 Family history of diabetes mellitus: Secondary | ICD-10-CM

## 2018-06-24 DIAGNOSIS — Z823 Family history of stroke: Secondary | ICD-10-CM | POA: Diagnosis not present

## 2018-06-24 DIAGNOSIS — I10 Essential (primary) hypertension: Secondary | ICD-10-CM | POA: Insufficient documentation

## 2018-06-24 DIAGNOSIS — Z7982 Long term (current) use of aspirin: Secondary | ICD-10-CM | POA: Diagnosis not present

## 2018-06-24 DIAGNOSIS — G8929 Other chronic pain: Secondary | ICD-10-CM | POA: Diagnosis present

## 2018-06-24 DIAGNOSIS — R05 Cough: Secondary | ICD-10-CM | POA: Diagnosis not present

## 2018-06-24 DIAGNOSIS — Z9049 Acquired absence of other specified parts of digestive tract: Secondary | ICD-10-CM

## 2018-06-24 DIAGNOSIS — F332 Major depressive disorder, recurrent severe without psychotic features: Secondary | ICD-10-CM | POA: Diagnosis not present

## 2018-06-24 DIAGNOSIS — Z87891 Personal history of nicotine dependence: Secondary | ICD-10-CM | POA: Insufficient documentation

## 2018-06-24 DIAGNOSIS — Z8262 Family history of osteoporosis: Secondary | ICD-10-CM

## 2018-06-24 DIAGNOSIS — Z811 Family history of alcohol abuse and dependence: Secondary | ICD-10-CM | POA: Diagnosis not present

## 2018-06-24 DIAGNOSIS — Z8249 Family history of ischemic heart disease and other diseases of the circulatory system: Secondary | ICD-10-CM | POA: Diagnosis not present

## 2018-06-24 DIAGNOSIS — Z882 Allergy status to sulfonamides status: Secondary | ICD-10-CM

## 2018-06-24 DIAGNOSIS — R45851 Suicidal ideations: Secondary | ICD-10-CM | POA: Insufficient documentation

## 2018-06-24 DIAGNOSIS — Z6841 Body Mass Index (BMI) 40.0 and over, adult: Secondary | ICD-10-CM

## 2018-06-24 DIAGNOSIS — J449 Chronic obstructive pulmonary disease, unspecified: Secondary | ICD-10-CM | POA: Insufficient documentation

## 2018-06-24 DIAGNOSIS — F14250 Cocaine dependence with cocaine-induced psychotic disorder with delusions: Secondary | ICD-10-CM | POA: Diagnosis not present

## 2018-06-24 DIAGNOSIS — F329 Major depressive disorder, single episode, unspecified: Secondary | ICD-10-CM | POA: Diagnosis present

## 2018-06-24 DIAGNOSIS — Z818 Family history of other mental and behavioral disorders: Secondary | ICD-10-CM | POA: Diagnosis not present

## 2018-06-24 DIAGNOSIS — F410 Panic disorder [episodic paroxysmal anxiety] without agoraphobia: Secondary | ICD-10-CM | POA: Diagnosis present

## 2018-06-24 DIAGNOSIS — F1424 Cocaine dependence with cocaine-induced mood disorder: Secondary | ICD-10-CM | POA: Diagnosis not present

## 2018-06-24 LAB — COMPREHENSIVE METABOLIC PANEL
ALT: 28 U/L (ref 0–44)
AST: 33 U/L (ref 15–41)
Albumin: 4 g/dL (ref 3.5–5.0)
Alkaline Phosphatase: 60 U/L (ref 38–126)
Anion gap: 8 (ref 5–15)
BUN: 15 mg/dL (ref 6–20)
CO2: 29 mmol/L (ref 22–32)
Calcium: 9 mg/dL (ref 8.9–10.3)
Chloride: 106 mmol/L (ref 98–111)
Creatinine, Ser: 0.99 mg/dL (ref 0.44–1.00)
GFR calc Af Amer: >60 mL/min (ref >60)
GFR calc non Af Amer: >60 mL/min (ref >60)
Glucose, Bld: 123 mg/dL — ABNORMAL HIGH (ref 70–99)
Potassium: 3.5 mmol/L (ref 3.5–5.1)
Sodium: 143 mmol/L (ref 135–145)
Total Bilirubin: 0.3 mg/dL (ref 0.3–1.2)
Total Protein: 7.6 g/dL (ref 6.5–8.1)

## 2018-06-24 LAB — CBC WITH DIFFERENTIAL/PLATELET
Abs Immature Granulocytes: 0.07 10*3/uL (ref 0.00–0.07)
Basophils Absolute: 0 10*3/uL (ref 0.0–0.1)
Basophils Relative: 0 %
Eosinophils Absolute: 0 10*3/uL (ref 0.0–0.5)
Eosinophils Relative: 0 %
HCT: 37.5 % (ref 36.0–46.0)
Hemoglobin: 11.7 g/dL — ABNORMAL LOW (ref 12.0–15.0)
Immature Granulocytes: 1 %
Lymphocytes Relative: 23 %
Lymphs Abs: 2.5 10*3/uL (ref 0.7–4.0)
MCH: 31 pg (ref 26.0–34.0)
MCHC: 31.2 g/dL (ref 30.0–36.0)
MCV: 99.5 fL (ref 80.0–100.0)
Monocytes Absolute: 0.8 10*3/uL (ref 0.1–1.0)
Monocytes Relative: 8 %
Neutro Abs: 7.1 10*3/uL (ref 1.7–7.7)
Neutrophils Relative %: 68 %
Platelets: 250 10*3/uL (ref 150–400)
RBC: 3.77 MIL/uL — ABNORMAL LOW (ref 3.87–5.11)
RDW: 13.7 % (ref 11.5–15.5)
WBC: 10.5 10*3/uL (ref 4.0–10.5)
nRBC: 0.2 % (ref 0.0–0.2)

## 2018-06-24 LAB — ETHANOL: Alcohol, Ethyl (B): <10 mg/dL (ref <10)

## 2018-06-24 LAB — ACETAMINOPHEN LEVEL: Acetaminophen (Tylenol), Serum: <10 ug/mL — ABNORMAL LOW (ref 10–30)

## 2018-06-24 LAB — SALICYLATE LEVEL: Salicylate Lvl: <7 mg/dL (ref 2.8–30.0)

## 2018-06-24 MED ORDER — ALUM & MAG HYDROXIDE-SIMETH 200-200-20 MG/5ML PO SUSP: 30.0000 mL | ORAL | Status: DC | PRN

## 2018-06-24 MED ORDER — MELOXICAM 15 MG PO TABS
15.0000 mg | ORAL_TABLET | Freq: Every day | ORAL | Status: DC
  Filled 2018-06-24: qty 1

## 2018-06-24 MED ORDER — QUETIAPINE FUMARATE 200 MG PO TABS
200.0000 mg | ORAL_TABLET | Freq: Every day | ORAL | Status: DC
  Administered 2018-06-25: 200 mg via ORAL
  Filled 2018-06-24 (×3): qty 1

## 2018-06-24 MED ORDER — ONDANSETRON HCL 4 MG PO TABS: 4.0000 mg | ORAL_TABLET | Freq: Three times a day (TID) | ORAL | Status: DC | PRN

## 2018-06-24 MED ORDER — ATORVASTATIN CALCIUM 20 MG PO TABS
20.0000 mg | ORAL_TABLET | Freq: Every day | ORAL | Status: DC
  Administered 2018-06-25 – 2018-06-26 (×2): 20 mg via ORAL
  Filled 2018-06-24 (×4): qty 1

## 2018-06-24 MED ORDER — DOCUSATE SODIUM 100 MG PO CAPS: 100.0000 mg | ORAL_CAPSULE | Freq: Every day | ORAL | Status: DC

## 2018-06-24 MED ORDER — ALUM & MAG HYDROXIDE-SIMETH 200-200-20 MG/5ML PO SUSP: 30.0000 mL | Freq: Four times a day (QID) | ORAL | Status: DC | PRN

## 2018-06-24 MED ORDER — LOSARTAN POTASSIUM 50 MG PO TABS
50.0000 mg | ORAL_TABLET | Freq: Every day | ORAL | Status: DC
  Filled 2018-06-24: qty 1

## 2018-06-24 MED ORDER — ASPIRIN EC 81 MG PO TBEC
81.0000 mg | DELAYED_RELEASE_TABLET | Freq: Every day | ORAL | Status: DC
  Administered 2018-06-25 – 2018-06-26 (×2): 81 mg via ORAL
  Filled 2018-06-24 (×5): qty 1

## 2018-06-24 MED ORDER — QUETIAPINE FUMARATE 300 MG PO TABS: 400.0000 mg | ORAL_TABLET | Freq: Every day | ORAL | Status: DC

## 2018-06-24 MED ORDER — DOCUSATE SODIUM 100 MG PO CAPS
100.0000 mg | ORAL_CAPSULE | Freq: Every day | ORAL | Status: DC
  Administered 2018-06-25 – 2018-06-26 (×2): 100 mg via ORAL
  Filled 2018-06-24 (×4): qty 1

## 2018-06-24 MED ORDER — BACLOFEN 10 MG PO TABS: 20.0000 mg | ORAL_TABLET | Freq: Every day | ORAL | Status: DC

## 2018-06-24 MED ORDER — ATORVASTATIN CALCIUM 20 MG PO TABS
20.0000 mg | ORAL_TABLET | Freq: Every day | ORAL | Status: DC
  Filled 2018-06-24: qty 1

## 2018-06-24 MED ORDER — LOSARTAN POTASSIUM 50 MG PO TABS
50.0000 mg | ORAL_TABLET | Freq: Every day | ORAL | Status: DC
  Administered 2018-06-25 – 2018-06-26 (×2): 50 mg via ORAL
  Filled 2018-06-24 (×4): qty 1

## 2018-06-24 MED ORDER — ACETAMINOPHEN 325 MG PO TABS: 650.0000 mg | ORAL_TABLET | ORAL | Status: DC | PRN

## 2018-06-24 MED ORDER — MAGNESIUM HYDROXIDE 400 MG/5ML PO SUSP: 30.0000 mL | Freq: Every day | ORAL | Status: DC | PRN

## 2018-06-24 MED ORDER — BACLOFEN 20 MG PO TABS
20.0000 mg | ORAL_TABLET | Freq: Every day | ORAL | Status: DC
  Administered 2018-06-25 – 2018-06-26 (×2): 20 mg via ORAL
  Filled 2018-06-24 (×4): qty 1

## 2018-06-24 NOTE — Progress Notes (Signed)
Patient has been lying in bed asleep since shift. She snores loudly and is dx with sleep apnea and COPD. Writer called her name several times to wake her up to change beds so her head could be elevated but patient is in a deep sleep. Safety maintained on unit with 15 min checks.
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
## 2018-06-24 NOTE — ED Provider Notes (Signed)
[facility]-EMERGENCY DEPT Provider Note   CSN: [id number] Arrival date & time: 06/24/18  1224    History   Chief Complaint Chief Complaint  Patient presents with  . Psychiatric Evaluation    HPI Rachel Alvarado is a 53 y.o. female.     The history is provided by the patient, the EMS personnel and medical records. No language interpreter was used.   Rachel Alvarado is a 53 y.o. female who presents to the Emergency Department complaining of psychiatric evaluation. She presents to the emergency department voluntarily by EMS for psychiatric evaluation. She states that for the last three days she is done crack cocaine and drink wild [place]. She reports inability to sleep with auditory and visual hallucinations. She thinks that somebody is trying to get in her house when she is sleeping. She has suicidal ideation with no plan. She thinks that she comes into the hospital for a night and people can watch her sleep that she will feel better. She has a history of similar episodes in the past. She has a history of hypertension, sleep apnea. She denies any recent illnesses. Patient did have coughing for EMS.  Pt states her cough is chronic and unchanged.  She denies fevers, chest pain, sob or sick contacts.   Past Medical History:  Diagnosis Date  . Anemia   . Anxiety   . Arthritis   . Chronic back pain   . COPD (chronic obstructive pulmonary disease) (HCC)   . Depression   . Hypertension   . Panic attacks   . Schizophrenia (HCC)   . Sleep apnea     Patient Active Problem List   Diagnosis Date Noted  . 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 Bilateral low back pain without sciatica 07/02/2015  . Knee pain, bilateral 07/02/2015  . Breast pain, right 11/05/2013  . Dizziness and giddiness 05/28/2012  . Screening for STDs (sexually transmitted diseases) 04/24/2012  . Schizophrenia (HCC) 04/03/2012  . History of sleep apnea 04/03/2012  .  Arthritis of both knees 04/03/2012  . Anxiety and depression 04/03/2012  . Musculoskeletal chest pain 04/03/2012  . Preventative health care 04/03/2012  . Morbid obesity with BMI of 45.0-49.9, adult (HCC) 04/03/2012    Past Surgical History:  Procedure Laterality Date  . CESAREAN SECTION    . CHOLECYSTECTOMY    . PARTIAL HYSTERECTOMY       OB History   No obstetric history on file.      Home Medications    Prior to Admission medications   Medication Sig Start Date End Date Taking? Authorizing Provider  aspirin 81 MG EC tablet Take 81 mg by mouth daily. Swallow whole.    [provider]  belladonna-opium (B&O SUPPRETTES) 16.2-30 MG suppository Place 1 suppository rectally every 8 (eight) hours as needed for pain. 04/16/16   Gerhard Munch, MD  Cholecalciferol (VITAMIN D-3) 5000 units TABS Take 5,000 Units by mouth daily.    [provider]  docusate sodium (COLACE) 100 MG capsule Take 200 mg by mouth daily.    [provider]  ibuprofen (ADVIL,MOTRIN) 800 MG tablet Take 1 tablet (800 mg total) by mouth 3 (three) times daily. 02/11/18   Fayrene Helper, PA-C  losartan (COZAAR) 100 MG tablet Take 100 mg by mouth daily.    [provider]  Multiple Vitamin (MULTIVITAMIN WITH MINERALS) TABS tablet Take 1 tablet by mouth daily.    [provider]  polyethylene glycol (  MIRALAX / GLYCOLAX) packet Take 17 g by mouth daily. Patient taking differently: Take 17 g by mouth daily as needed for mild constipation.  04/16/16   Gerhard Munch, MD  QUEtiapine (SEROQUEL) 400 MG tablet Take 400 mg by mouth at bedtime.    [provider]  senna-docusate (SENOKOT-S) 8.6-50 MG tablet Take 1 tablet by mouth daily. Patient taking differently: Take 1 tablet by mouth at bedtime as needed for mild constipation.  04/16/16   Gerhard Munch, MD    Family History Family History  Problem Relation Age of Onset  . Alcohol abuse Father   . Heart disease  Father   . Diabetes Mellitus II Father   . Diabetes Father   . Hypertension Father   . Osteoporosis Mother   . Heart disease Mother   . Diabetes Mellitus II Brother   . Stroke Sister     Social History Social History   Tobacco Use  . Smoking status: Former Smoker    Packs/day: 0.02    Types: Cigarettes    Last attempt to quit: 02/03/2012    Years since quitting: 6.3  . Smokeless tobacco: Never Used  Substance Use Topics  . Alcohol use: No  . Drug use: No    Types: Cocaine, Marijuana    Comment: cocaine in a "blue moon, marijuana twice a wk", pt states she stopped using last month     Allergies   Sulfa antibiotics   Review of Systems Review of Systems  All other systems reviewed and are negative.    Physical Exam Updated Vital Signs There were no vitals taken for this visit.  Physical Exam Vitals signs and nursing note reviewed.  Constitutional:      Appearance: She is well-developed.  HENT:     Head: Normocephalic and atraumatic.  Cardiovascular:     Rate and Rhythm: Normal rate and regular rhythm.  Pulmonary:     Effort: Pulmonary effort is normal. No respiratory distress.  Musculoskeletal: Normal range of motion.  Skin:    General: Skin is warm.  Neurological:     Mental Status: She is alert and oriented to person, place, and time.  Psychiatric:     Comments: Flat affect      ED Treatments / Results  Labs (all labs ordered are listed, but only abnormal results are displayed) Labs Reviewed  COMPREHENSIVE METABOLIC PANEL  ETHANOL  ACETAMINOPHEN LEVEL  SALICYLATE LEVEL  RAPID URINE DRUG SCREEN, HOSP PERFORMED    EKG None  Radiology No results found.  Procedures Procedures (including critical care time)  Medications Ordered in ED Medications - No data to display   Initial Impression / Assessment and Plan / ED Course  I have reviewed the triage vital signs and the nursing notes.  Pertinent labs & imaging results that were  available during my care of the patient were reviewed by me and considered in my medical decision making (see chart for details).        Pt with hx/o polysubstance abuse here with SI and hallucinations, no plan.  She has been medically cleared for psychiatric evaluation and treatment.    Final Clinical Impressions(s) / ED Diagnoses   Final diagnoses:  None    ED Discharge Orders    None       Tilden Fossa, MD 06/24/18 1456
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
## 2018-06-24 NOTE — Tx Team (Signed)
Initial Treatment Plan 06/24/2018 6:30 PM Rachel Alvarado PPJ:[id number]    PATIENT STRESSORS: Financial difficulties Substance abuse   PATIENT STRENGTHS: Motivation for treatment/growth Supportive family/friends   PATIENT IDENTIFIED PROBLEMS: 1. "Getting outta here and paying bills"  2. "I need to help my granddaughter"                   DISCHARGE CRITERIA:  Improved stabilization in mood, thinking, and/or behavior  PRELIMINARY DISCHARGE PLAN: Attend 12-step recovery group Outpatient therapy  PATIENT/FAMILY INVOLVEMENT: This treatment plan has been presented to and reviewed with the patient, Rachel Alvarado.  The patient has been given the opportunity to ask questions and make suggestions.  Kirstie Mirza, RN 06/24/2018, 6:30 PM

## 2018-06-24 NOTE — ED Notes (Signed)
This writer attempted to obtain labs on this patient. Patient would not turn on her back to lay flat.Patient was moving arm when I attempted to stick her and and then she jumped and said that it hurt. Needle removed and no labs collected.

## 2018-06-24 NOTE — ED Notes (Signed)
Report given to John RN.

## 2018-06-24 NOTE — ED Notes (Signed)
Pt was told that it was time to go to [facility].  Pt asked how she was going to get there.  Pt was informed that there was a car for her out front. Pt asked how she was going to go up to the front.  Pt was informed that she was to walk.  Pt became verbally aggressive and stated things like "I need to get out of here now! You can't treat people like this! If I was Rachel Alvarado, I'd fire you!"  Pt was able to ambulate from bed to a nearby wheelchair where she was escorted out of department with security and off duty [employer].
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
## 2018-06-24 NOTE — ED Notes (Signed)
Pt endorses crack use today, "before day break"

## 2018-06-24 NOTE — ED Notes (Addendum)
Attempted to collect an EKG on pt but pt refused saying "I don't want it done. I just want to get some sleep." Dr. Madilyn Hook made aware of pt's refusal.

## 2018-06-24 NOTE — ED Notes (Signed)
[facility] called for transport. 

## 2018-06-25 DIAGNOSIS — F14259 Cocaine dependence with cocaine-induced psychotic disorder, unspecified: Secondary | ICD-10-CM

## 2018-06-25 DIAGNOSIS — F250 Schizoaffective disorder, bipolar type: Secondary | ICD-10-CM

## 2018-06-25 DIAGNOSIS — Z818 Family history of other mental and behavioral disorders: Secondary | ICD-10-CM

## 2018-06-25 DIAGNOSIS — F14250 Cocaine dependence with cocaine-induced psychotic disorder with delusions: Secondary | ICD-10-CM

## 2018-06-25 MED ORDER — BENZTROPINE MESYLATE 0.5 MG PO TABS
0.5000 mg | ORAL_TABLET | Freq: Two times a day (BID) | ORAL | Status: DC
  Administered 2018-06-25 – 2018-06-26 (×3): 0.5 mg via ORAL
  Filled 2018-06-25 (×8): qty 1

## 2018-06-25 MED ORDER — HALOPERIDOL 5 MG PO TABS
10.0000 mg | ORAL_TABLET | Freq: Two times a day (BID) | ORAL | Status: DC
  Administered 2018-06-25 – 2018-06-26 (×3): 10 mg via ORAL
  Filled 2018-06-25 (×8): qty 2

## 2018-06-25 MED ORDER — TEMAZEPAM 15 MG PO CAPS
30.0000 mg | ORAL_CAPSULE | Freq: Every day | ORAL | Status: DC
  Administered 2018-06-25: 30 mg via ORAL
  Filled 2018-06-25: qty 2

## 2018-06-25 NOTE — Tx Team (Signed)
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 alum & mag hydroxide-simeth (MAALOX/MYLANTA) 200-200-20 MG/5ML suspension 30 mL  30 mL Oral Q6H PRN [name], NP      . aspirin EC tablet 81 mg  81 mg Oral Daily [name], NP   81 mg at 06/25/18 0920  . atorvastatin (LIPITOR) tablet 20 mg  20 mg Oral Daily [name], NP   20 mg at 06/25/18 0919  . baclofen (LIORESAL) tablet 20 mg  20 mg Oral Daily [name], NP   20 mg at 06/25/18 0919  . benztropine (COGENTIN) tablet 0.5 mg  0.5 mg Oral BID [name], MD   0.5 mg at 06/25/18 1032  . docusate sodium (COLACE) capsule 100 mg  100 mg Oral Daily [name], NP   100 mg at 06/25/18 0919  . haloperidol (HALDOL) tablet 10 mg  10 mg Oral BID [name], MD   10 mg at 06/25/18 1032  . losartan (COZAAR) tablet 50 mg  50 mg Oral Daily [name], NP   50 mg at 06/25/18 0920  . magnesium hydroxide (MILK OF MAGNESIA) suspension 30 mL  30 mL Oral Daily PRN [name], NP      . ondansetron [facility]) tablet 4 mg  4 mg Oral Q8H PRN [name], NP      . temazepam (RESTORIL) capsule 30 mg  30 mg Oral QHS [name], MD       PTA Medications: Medications Prior to Admission  Medication Sig Dispense Refill Last Dose  . aspirin 81 MG EC tablet Take 81 mg by mouth daily. Swallow whole.   Past Week at Unknown time  . atorvastatin (LIPITOR) 20 MG tablet Take 20 mg by mouth daily.   Past Week at Unknown time  .  baclofen (LIORESAL) 20 MG tablet Take 20 mg by mouth daily.    Past Week at Unknown time  . belladonna-opium (B&O SUPPRETTES) 16.2-30 MG suppository Place 1 suppository rectally every 8 (eight) hours as needed for pain. 12 suppository 0 unknown  . docusate sodium (COLACE) 100 MG capsule Take 100 mg by mouth daily.    Past Week at Unknown time  . ibuprofen (ADVIL,MOTRIN) 800 MG tablet Take 1 tablet (800 mg total) by mouth 3 (three) times daily. (Patient not taking: Reported on 06/24/2018) 21 tablet 0 Completed Course at Unknown time  . losartan (COZAAR) 50 MG tablet Take 50 mg by mouth daily.   Past Week at Unknown time  . meloxicam (MOBIC) 15 MG tablet Take 15 mg by mouth daily.   Past Week at Unknown time  . polyethylene glycol (MIRALAX / GLYCOLAX) packet Take 17 g by mouth daily. (Patient not taking: Reported on 06/24/2018) 14 each 0 Completed Course at Unknown time  . QUEtiapine (SEROQUEL) 400 MG tablet Take 400 mg by mouth at bedtime.   Past Week at Unknown time  . senna-docusate (SENOKOT-S) 8.6-50 MG tablet Take 1 tablet by mouth daily. (Patient not taking: Reported on 06/24/2018) 14 tablet 0 Completed Course at Unknown  time    Patient Stressors: Financial difficulties Substance abuse  Patient Strengths: Motivation for treatment/growth Supportive family/friends  Treatment Modalities: Medication Management, Group therapy, Case management,  1 to 1 session with clinician, Psychoeducation, Recreational therapy.   Physician Treatment Plan for Primary Diagnosis: <principal problem not specified> Long Term Goal(s): Improvement in symptoms so as ready for discharge Improvement in symptoms so as ready for discharge   Short Term Goals: Ability to identify changes in lifestyle to reduce recurrence of condition will improve Ability to identify triggers associated with substance abuse/mental health issues will improve Ability to identify changes in lifestyle to reduce recurrence of condition will  improve Ability to verbalize feelings will improve Ability to disclose and discuss suicidal ideas Ability to demonstrate self-control will improve Ability to identify and develop effective coping behaviors will improve Ability to maintain clinical measurements within normal limits will improve  Medication Management: Evaluate patient's response, side effects, and tolerance of medication regimen.  Therapeutic Interventions: 1 to 1 sessions, Unit Group sessions and Medication administration.  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[name])  Long Term Goal(s): Improvement in symptoms so as ready for discharge Improvement in symptoms so as ready for discharge   Short Term Goals: Ability to identify changes in lifestyle to reduce recurrence of condition will improve Ability to identify triggers associated with substance abuse/mental health issues will improve Ability to identify changes in lifestyle to reduce recurrence of condition will improve Ability to verbalize feelings will improve Ability to disclose and discuss suicidal ideas Ability to demonstrate self-control will improve Ability to identify and develop effective coping behaviors will improve Ability to maintain clinical measurements within normal limits will improve     Medication Management: Evaluate patient's response, side effects, and tolerance of medication regimen.  Therapeutic Interventions: 1 to 1 sessions, Unit Group sessions and Medication administration.  Evaluation of Outcomes: Not Met   RN Treatment Plan for Primary Diagnosis: <principal problem not specified> Long Term Goal(s): Knowledge of disease and therapeutic regimen to maintain health will  improve  Short Term Goals: Ability to identify and develop effective coping behaviors will improve and Compliance with prescribed medications will improve  Medication Management: RN will administer medications as ordered by provider, will assess and evaluate patient's response and provide education to patient for prescribed medication. RN will report any adverse and/or side effects to prescribing provider.  Therapeutic Interventions: 1 on 1 counseling sessions, Psychoeducation, Medication administration, Evaluate responses to treatment, Monitor vital signs and CBGs as ordered, Perform/monitor CIWA, COWS, AIMS and Fall Risk screenings as ordered, Perform wound care treatments as ordered.  Evaluation of Outcomes: Not Met   LCSW Treatment Plan for Primary Diagnosis: <principal problem not specified> Long Term Goal(s): Safe transition to appropriate next level of care at discharge, Engage patient in therapeutic group addressing interpersonal concerns.  Short Term Goals: Engage patient in aftercare planning with referrals and resources, Increase social support and Increase skills for wellness and recovery  Therapeutic Interventions: Assess for all discharge needs, 1 to 1 time with Social worker, Explore available resources and support systems, Assess for adequacy in community support network, Educate family and significant other(s) on suicide prevention, Complete Psychosocial Assessment, Interpersonal group therapy.  Evaluation of Outcomes: Not Met   Progress in Treatment: Attending groups: No. Participating  in groups: No. Taking medication as prescribed: Yes. Toleration medication: Yes. Family/Significant other contact made: No, will contact:  pt declined consent Patient understands diagnosis: Yes. Discussing patient identified problems/goals with staff: Yes. Medical problems stabilized or resolved: Yes. Denies suicidal/homicidal ideation: Yes. Issues/concerns per patient self-inventory:  No. Other: none  New problem(s) identified: No, Describe:  none  New Short Term/Long Term Goal(s):  Patient Goals:  "I need to leave"  Discharge Plan or Barriers:   Reason for Continuation of Hospitalization: Delusions  Medication stabilization  Estimated Length of Stay: 2-4 days.  Attendees: Patient:Rachel Alvarado 06/25/2018   Physician: Dr. Jake Samples, MD 06/25/2018   Nursing: Sena Hitch, RN 06/25/2018   RN Care Manager: 06/25/2018   Social Worker: Lurline Idol, LCSW 06/25/2018   Recreational Therapist:  06/25/2018   Other:  06/25/2018   Other:  06/25/2018   Other: 06/25/2018      Scribe for Treatment Team: Joanne Chars, [place] 06/25/2018 12:54 PM

## 2018-06-25 NOTE — Progress Notes (Signed)
Pt did not attend wrap-up group   

## 2018-06-25 NOTE — Progress Notes (Signed)
Dar Note: Patient presents with irritable affect and mood.  Preoccupied and paranoid about her granddaughter being kidnap.  Medication given with encouragement.  Denies suicidal thoughts.  Routine safety checks maintained every 15 minutes.  Support and encouragement offered as needed.  Patient is safe on the unit.  States goal is discharge and to pay her bills.

## 2018-06-25 NOTE — [facility] Group Notes (Signed)
[facility] LCSW Group Therapy Note  Date/Time: 06/25/18, 1315  Type of Therapy and Topic:  Group Therapy:  Overcoming Obstacles  Participation Level:  Did not attend  Description of Group:    In this group patients will be encouraged to explore what they see as obstacles to their own wellness and recovery. They will be guided to discuss their thoughts, feelings, and behaviors related to these obstacles. The group will process together ways to cope with barriers, with attention given to specific choices patients can make. Each patient will be challenged to identify changes they are motivated to make in order to overcome their obstacles. This group will be process-oriented, with patients participating in exploration of their own experiences as well as giving and receiving support and challenge from other group members.  Therapeutic Goals: 1. Patient will identify personal and current obstacles as they relate to admission. 2. Patient will identify barriers that currently interfere with their wellness or overcoming obstacles.  3. Patient will identify feelings, thought process and behaviors related to these barriers. 4. Patient will identify two changes they are willing to make to overcome these obstacles:    Summary of Patient Progress      Therapeutic Modalities:   Cognitive Behavioral Therapy Solution Focused Therapy Motivational Interviewing Relapse Prevention Therapy  Daleen [employer]ibb, LCSW

## 2018-06-25 NOTE — Progress Notes (Signed)
[facility] MD Progress Note  06/25/2018 9:50 AM Rachel Alvarado  MRN:  [id number] Subjective:    Patient is in bed very irritable when she is awake and stating we are "blocking the air" and becomes very focused on her breathing she clearly has apnea when she is sleeping due to her obesity.  At any rate still hypomanic irritable poorly cooperative and not answering questions particular meaningfully so much is yelling at the examiner.  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 Anemia   . Anxiety   . Arthritis   . Chronic back pain   . COPD (chronic obstructive pulmonary disease) (HCC)   . Depression   . Hypertension   . Panic attacks   . Schizophrenia (HCC)   . Sleep apnea     Past Surgical History:  Procedure Laterality Date  . CESAREAN SECTION    . CHOLECYSTECTOMY    . PARTIAL HYSTERECTOMY     Family History:  Family History  Problem Relation Age of Onset  . Alcohol abuse Father   . Heart disease Father   . Diabetes Mellitus II Father   . Diabetes Father   . Hypertension Father   . Osteoporosis Mother   . Heart disease Mother   . Diabetes Mellitus II Brother   . Stroke Sister     Social History:  Social History   Substance and Sexual Activity  Alcohol Use No     Social History   Substance and Sexual Activity  Drug Use No  . Types: Cocaine, Marijuana   Comment: cocaine in a "blue moon, marijuana twice a wk", pt states  she stopped using last month    Social History   Socioeconomic History  . Marital status: Married    Spouse name: Not on file  . Number of children: Not on file  . Years of education: Not on file  . Highest education level: Not on file  Occupational History  . Not on file  Social Needs  . Financial resource strain: Not on file  . Food insecurity:    Worry: Not on file    Inability: Not on file  . Transportation needs:    Medical: Not on file    Non-medical: Not on file  Tobacco Use  . Smoking status: Former Smoker    Packs/day: 0.02    Types: Cigarettes    Last attempt to quit: 02/03/2012    Years since quitting: 6.3  . Smokeless tobacco: Never Used  Substance and Sexual Activity  . Alcohol use: No  . Drug use: No    Types: Cocaine, Marijuana    Comment: cocaine in a "blue moon, marijuana twice a wk", pt states she stopped using last month  . Sexual activity: Never  Lifestyle  . Physical activity:    Days per week: Not on file    Minutes per session: Not on file  . Stress: Not on file  Relationships  . Social connections:    Talks on phone: Not on  file    Gets together: Not on file    Attends religious service: Not on file    Active member of club or organization: Not on file    Attends meetings of clubs or organizations: Not on file    Relationship status: Not on file  Other Topics Concern  . Not on file  Social History Narrative  . Not on file   Additional Social History:                         Sleep: Good  Appetite:  Good  Current Medications: Current Facility-Administered Medications  Medication Dose Route Frequency Provider Last Rate Last Dose  . alum & mag hydroxide-simeth (MAALOX/MYLANTA) 200-200-20 MG/5ML suspension 30 mL  30 mL Oral Q6H PRN Charm Rings, NP      . aspirin EC tablet 81 mg  81 mg Oral Daily Charm Rings, NP   81 mg at 06/25/18 0920  . atorvastatin (LIPITOR) tablet 20 mg  20 mg Oral Daily Charm Rings, NP   20  mg at 06/25/18 0919  . baclofen (LIORESAL) tablet 20 mg  20 mg Oral Daily Charm Rings, NP   20 mg at 06/25/18 0919  . benztropine (COGENTIN) tablet 0.5 mg  0.5 mg Oral BID Malvin Johns, MD      . docusate sodium (COLACE) capsule 100 mg  100 mg Oral Daily Charm Rings, NP   100 mg at 06/25/18 0865  . haloperidol (HALDOL) tablet 10 mg  10 mg Oral BID Malvin Johns, MD      . losartan (COZAAR) tablet 50 mg  50 mg Oral Daily Charm Rings, NP   50 mg at 06/25/18 0920  . magnesium hydroxide (MILK OF MAGNESIA) suspension 30 mL  30 mL Oral Daily PRN Charm Rings, NP      . ondansetron [facility]) tablet 4 mg  4 mg Oral Q8H PRN Charm Rings, NP      . temazepam (RESTORIL) capsule 30 mg  30 mg Oral QHS Malvin Johns, MD        Lab Results:  Results for orders placed or performed during the hospital encounter of 06/24/18 (from the past 48 hour(s))  Comprehensive metabolic panel     Status: Abnormal   Collection Time: 06/24/18 12:50 PM  Result Value Ref Range   Sodium 143 135 - 145 mmol/L   Potassium 3.5 3.5 - 5.1 mmol/L   Chloride 106 98 - 111 mmol/L   CO2 29 22 - 32 mmol/L   Glucose, Bld 123 (H) 70 - 99 mg/dL   BUN 15 6 - 20 mg/dL   Creatinine, Ser 7.84 0.44 - 1.00 mg/dL   Calcium 9.0 8.9 - 69.6 mg/dL   Total Protein 7.6 6.5 - 8.1 g/dL   Albumin 4.0 3.5 - 5.0 g/dL   AST 33 15 - 41 U/L   ALT 28 0 - 44 U/L   Alkaline Phosphatase 60 38 - 126 U/L   Total Bilirubin 0.3 0.3 - 1.2 mg/dL   GFR calc non Af Amer >60 >60 mL/min   GFR calc Af Amer >60 >60 mL/min   Anion gap 8 5 - 15    Comment: Performed at [facility], [address], [place], [place] [address]  Ethanol     Status: None   Collection Time: 06/24/18 12:50 PM  Result Value Ref Range   Alcohol, Ethyl (B) <10 <10 mg/dL  Comment: (NOTE) Lowest detectable limit for serum alcohol is 10 mg/dL. For medical purposes only. Performed at [facility], [address] [place], [place]  [address]   Acetaminophen level     Status: Abnormal   Collection Time: 06/24/18 12:50 PM  Result Value Ref Range   Acetaminophen (Tylenol), Serum <10 (L) 10 - 30 ug/mL    Comment: (NOTE) Therapeutic concentrations vary significantly. A range of 10-30 ug/mL  may be an effective concentration for many patients. However, some  are best treated at concentrations outside of this range. Acetaminophen concentrations >150 ug/mL at 4 hours after ingestion  and >50 ug/mL at 12 hours after ingestion are often associated with  toxic reactions. Performed at [facility], [address] [place], [place] [address]   Salicylate level     Status: None   Collection Time: 06/24/18 12:50 PM  Result Value Ref Range   Salicylate Lvl <7.0 2.8 - 30.0 mg/dL    Comment: Performed at [facility], [address] [place], [place] [address]  CBC with Differential     Status: Abnormal   Collection Time: 06/24/18 12:50 PM  Result Value Ref Range   WBC 10.5 4.0 - 10.5 K/uL   RBC 3.77 (L) 3.87 - 5.11 MIL/uL   Hemoglobin 11.7 (L) 12.0 - 15.0 g/dL   HCT 91.437.5 78.236.0 - 95.646.0 %   MCV 99.5 80.0 - 100.0 fL   MCH 31.0 26.0 - 34.0 pg   MCHC 31.2 30.0 - 36.0 g/dL   RDW 21.313.7 08.611.5 - 57.815.5 %   Platelets 250 150 - 400 K/uL   nRBC 0.2 0.0 - 0.2 %   Neutrophils Relative % 68 %   Neutro Abs 7.1 1.7 - 7.7 K/uL   Lymphocytes Relative 23 %   Lymphs Abs 2.5 0.7 - 4.0 K/uL   Monocytes Relative 8 %   Monocytes Absolute 0.8 0.1 - 1.0 K/uL   Eosinophils Relative 0 %   Eosinophils Absolute 0.0 0.0 - 0.5 K/uL   Basophils Relative 0 %   Basophils Absolute 0.0 0.0 - 0.1 K/uL   Immature Granulocytes 1 %   Abs Immature Granulocytes 0.07 0.00 - 0.07 K/uL    Comment: Performed at [facility], [address] [place], [place] [address]    Blood Alcohol level:  Lab Results  Component Value Date   ETH <10 06/24/2018   ETH <10 08/19/2017    Metabolic Disorder Labs: No results  found for: HGBA1C, MPG No results found for: PROLACTIN No results found for: CHOL, TRIG, HDL, CHOLHDL, VLDL, LDLCALC  Physical Findings: AIMS: Facial and Oral Movements Muscles of Facial Expression: None, normal Lips and Perioral Area: None, normal Jaw: None, normal Tongue: None, normal,Extremity Movements Upper (arms, wrists, hands, fingers): None, normal Lower (legs, knees, ankles, toes): None, normal, Trunk Movements Neck, shoulders, hips: None, normal, Overall Severity Severity of abnormal movements (highest score from questions above): None, normal Incapacitation due to abnormal movements: None, normal Patient's awareness of abnormal movements (rate only patient's report): No Awareness, Dental Status Current problems with teeth and/or dentures?: No Does patient usually wear dentures?: No  CIWA:  CIWA-Ar Total: 3 COWS:  COWS Total Score: 1  Musculoskeletal: Strength & Muscle Tone: within normal limits Gait & Station: normal Patient leans: N/A  Psychiatric Specialty Exam: Physical Exam  ROS  Blood pressure 111/81, pulse 91, temperature 99.1 F (37.3 C), temperature source Oral, resp. rate 16, height 5\' 3"  (1.6 m), weight  120 kg.Body mass index is 46.86 kg/m.  General Appearance: Guarded  Eye Contact:  Minimal  Speech:  Pressured  Volume:  Increased  Mood:  Angry and Irritable  Affect:  Labile  Thought Process:  Disorganized  Orientation:  Full (Time, Place, and Person)  Thought Content:  Illogical and Delusions  Suicidal Thoughts:  No  Homicidal Thoughts:  No  Memory:  Immediate;   Poor  Judgement:  Impaired  Insight:  Lacking  Psychomotor Activity:  Normal  Concentration:  Concentration: Fair  Recall:  [employer] of Knowledge:  Fair  Language:  Fair  Akathisia:  Negative  Handed:  Right  AIMS (if indicated):     Assets:  Physical Health Resilience  ADL's:  Intact  Cognition:  WNL  Sleep:  Number of Hours: 6.75     Treatment Plan Summary: Daily  contact with patient to assess and evaluate symptoms and progress in treatment, Medication management and Plan Continue current precautions add Haldol as we clearly need to stabilize this schizoaffective type and cocaine induced type of psychosis fairly quickly due to agitation continue reality-based therapy  Wilferd Ritson, MD 06/25/2018, 9:50 AM

## 2018-06-25 NOTE — Progress Notes (Addendum)
D: Pt denies SI/HI/AVH. Pt irritable, pt has been sleep much of the evening. Pt continues t be paranoid about her air , pt continues to be irritable, but was redirectable this evening. A: Pt was offered support and encouragement. Pt was encourage to attend groups. Q 15 minute checks were done for safety.  R: safety maintained on unit.  Problem: Safety: Goal: Ability to remain free from injury will improve Outcome: Progressing   Problem: Activity: Goal: Sleeping patterns will improve Outcome: Progressing

## 2018-06-25 NOTE — [facility] Suicide Risk Assessment (Signed)
[facility] INPATIENT:  Family/Significant Other Suicide Prevention Education  Suicide Prevention Education:  Patient Refusal for Family/Significant Other Suicide Prevention Education: The patient Rachel Alvarado has refused to provide written consent for family/significant other to be provided Family/Significant Other Suicide Prevention Education during admission and/or prior to discharge.  Physician notified.  Lorri Frederick, LCSW 06/25/2018, 11:52 AM

## 2018-06-25 NOTE — Progress Notes (Signed)
Recreation Therapy Notes  INPATIENT RECREATION THERAPY ASSESSMENT  Patient Details Name: NYANZA KINDSCHI MRN: [id number] DOB: December 01, 1965 Today's Date: 06/25/2018       Information Obtained From: Patient  Able to Participate in Assessment/Interview: Yes  Patient Presentation: Alert  Reason for Admission (Per Patient): Suicidal Ideation, Other (Comments)(Paranoid)  Patient Stressors: Relationship, Other (Comment)(Weight)  Coping Skills:   Isolation, TV, Music, Meditate, Deep Breathing, Impulsivity, Talk, Prayer, Avoidance, Read  Leisure Interests (2+):  Games - Cards, Individual - TV, Music - Listen  Frequency of Recreation/Participation: Other (Comment)(Daily)  Awareness of Community Resources:  Yes  Community Resources:  [employer]  Current Use: Yes  If no, Barriers?:    Expressed Interest in [employer] Information: No  [place] of Residence:  [place]  Patient Main Form of Transportation: Set designer  Patient Strengths:  Mind; Relationship with God  Patient Identified Areas of Improvement:  Weight; Continue studying for CDL and law  Patient Goal for Hospitalization:  "getting some rest and going out in the world"  Current SI (including self-harm):  No  Current HI:  No  Current AVH: No  Staff Intervention Plan: Group Attendance, Collaborate with Interdisciplinary Treatment Team  Consent to Intern Participation: N/A   Caroll Rancher, LRT/CTRS  Caroll Rancher A 06/25/2018, 2:40 PM
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
## 2018-06-26 MED ORDER — BENZTROPINE MESYLATE 0.5 MG PO TABS: 0.5000 mg | ORAL_TABLET | Freq: Two times a day (BID) | ORAL | 1 refills | Status: AC

## 2018-06-26 MED ORDER — TOPIRAMATE 50 MG PO TABS: 50.0000 mg | ORAL_TABLET | Freq: Three times a day (TID) | ORAL | 2 refills | Status: AC

## 2018-06-26 MED ORDER — HALOPERIDOL 10 MG PO TABS: 10.0000 mg | ORAL_TABLET | Freq: Every day | ORAL | 1 refills | Status: AC

## 2018-06-26 NOTE — Discharge Summary (Signed)
Physician Discharge Summary Note  Patient:  Rachel Alvarado is an 53 y.o., female MRN:  [id number] DOB:  [birth year] Patient phone:  [phone] (home)  Patient address:   [address] 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  Denies prior psychiatric admissions.  Denies history of suicide attempts or self-injurious behaviors.  Chart notes indicate an ED visit on 2/3 2018 where she reported Seroquel overdose.  Does not endorse any clear history of mania or hypomania .  She denies PTSD history.  Denies history of violence.  Reports history of panic attacks, currently denies agoraphobia.  Chart notes indicate prior ED visits related to anxiety/insomnia in the context of cocaine abuse.  Past Medical History:  Past Medical History:  Diagnosis Date  . Anemia   . Anxiety   . Arthritis   . Chronic back pain   . COPD (chronic obstructive pulmonary disease) (HCC)   . Depression   . Hypertension   . Panic attacks   . Schizophrenia (HCC)   . Sleep apnea     Past Surgical History:  Procedure Laterality Date  . CESAREAN SECTION    . CHOLECYSTECTOMY    . PARTIAL HYSTERECTOMY     Family History:  Family History  Problem Relation Age of Onset  . Alcohol abuse Father   . Heart disease Father   . Diabetes Mellitus II Father   .  Diabetes Father   . Hypertension Father   . Osteoporosis Mother   . Heart disease Mother   . Diabetes Mellitus II Brother   . Stroke Sister    Family Psychiatric  History: From admission H&P: Reports history of a sister having depression, mood swings and suicide attempts in the past.  No completed suicides in family.  Reports a strong history of alcohol and substance abuse and extended family.  Social History:  Social History   Substance and Sexual Activity  Alcohol Use No     Social History   Substance and Sexual Activity  Drug Use No  . Types: Cocaine, Marijuana   Comment: cocaine in a "blue moon, marijuana twice a wk", pt states she stopped using last month    Social History   Socioeconomic History  . Marital status: Married    Spouse name: Not on file  . Number of children: Not on file  . Years of education: Not on file  . Highest education level: Not on file  Occupational History  . Not on file  Social Needs  . Financial resource strain: Not on file  . Food insecurity:    Worry: Not on file    Inability: Not on file  . Transportation needs:    Medical: Not on file    Non-medical: Not on file  Tobacco Use  . Smoking status: Former Smoker    Packs/day: 0.02    Types: Cigarettes    Last attempt to quit: 02/03/2012    Years  since quitting: 6.3  . Smokeless tobacco: Never Used  Substance and Sexual Activity  . Alcohol use: No  . Drug use: No    Types: Cocaine, Marijuana    Comment: cocaine in a "blue moon, marijuana twice a wk", pt states she stopped using last month  . Sexual activity: Never  Lifestyle  . Physical activity:    Days per week: Not on file    Minutes per session: Not on file  . Stress: Not on file  Relationships  . Social connections:    Talks on phone: Not on file    Gets together: Not on file    Attends religious service: Not on file    Active member of club or organization: Not on file    Attends meetings of clubs or organizations: Not  on file    Relationship status: Not on file  Other Topics Concern  . Not on file  Social History Narrative  . Not on file    Hospital Course:  From admission H&P 06/24/2018: Presented to the hospital on 4/5 after calling police on several occasions reporting that her granddaughter was being eaten by cannibals .  She reported almost no sleep at all  x3/4 days prior to admission related to heavy use of crack cocaine.  She also endorsed some alcohol consumption, although not regularly. States " I knew it was going to happen, because I hallucinate when I stay up more than two days. I went too far" .  At this time patient no longer appears to be actively psychotic.  States "I was convinced that my granddaughter was dead, that the people I was seen where candles were eating her, I was hearing voices, seeing things".  States that in the context of above she was experiencing suicidal ideations, which she currently denies having. She does endorse prior history of psychiatric illness and states she has been diagnosed with schizophrenia in the past. She is prescribed Seroquel, which she states she has not taken in several days. She reports a history of cocaine abuse in binges.  Reports she has been using for "4 days straight".  Prior to this had last used 3 weeks ago.  She also describes some alcohol consumption over recent days but not regularly.  Admission BAL negative.  No admission UDS. Endorses some depression, currently denies suicidal ideations.   Ms. Valarie ConesDancy was admitted for psychosis in the context of cocaine abuse. She was irritable on admission with pressured speech. She declined to participate in group therapy and remained in bed. Haldol and Cogentin were started. She responded well to treatment with no adverse effects reported. [facility]. She stabilized with medication. She was discharged on the medications listed below. She has shown improvement with improved mood, affect,  sleep, appetite, and interaction. She denies any SI/HI/AVH and contracts for safety. [facility]see below). Patient is provided with prescriptions for medications upon discharge. She is discharging with bus pass home.  Physical Findings: AIMS: Facial and Oral Movements Muscles of Facial Expression: None, normal Lips and Perioral Area: None, normal Jaw: None, normal Tongue: None, normal,Extremity Movements Upper (arms, wrists, hands, fingers): None, normal Lower (legs, knees, ankles, toes): None, normal, Trunk Movements Neck, shoulders, hips: None, normal, Overall Severity Severity of abnormal movements (highest score from questions above): None, normal Incapacitation due to abnormal movements: None, normal Patient's awareness of abnormal movements (rate only patient's report): No Awareness, Dental  Status Current problems with teeth and/or dentures?: No Does patient usually wear dentures?: No  CIWA:  CIWA-Ar Total: 3 COWS:  COWS Total Score: 1  Musculoskeletal: Strength & Muscle Tone: within normal limits Gait & Station: normal Patient leans: N/A  Psychiatric Specialty Exam: Physical Exam  Nursing note and vitals reviewed. Constitutional: She is oriented to person, place, and time. She appears well-developed and well-nourished.  Cardiovascular: Normal rate.  Respiratory: Effort normal.  Neurological: She is alert and oriented to person, place, and time.    Review of Systems  Constitutional: Negative.   Psychiatric/Behavioral: Positive for substance abuse (cocaine). Negative for depression, hallucinations, memory loss and suicidal ideas. The patient is not nervous/anxious and does not have insomnia.     Blood pressure 111/81, pulse 91, temperature 99.1 F (37.3 C), temperature source Oral, resp. rate 16, height 5\' 3"  (1.6 m), weight 120 kg.Body mass index is 46.86 kg/m.  General Appearance: Fairly Groomed  Eye Contact:  Fair  Speech:   Clear and Coherent  Volume:  Increased  Mood:  Irritable  Affect:  Congruent  Thought Process:  Coherent  Orientation:  Full (Time, Place, and Person)  Thought Content:  WDL  Suicidal Thoughts:  No  Homicidal Thoughts:  No  Memory:  Immediate;   Fair  Judgement:  Fair  Insight:  Fair  Psychomotor Activity:  Normal  Concentration:  Concentration: Fair  Recall:  [employer] of Knowledge:  Fair  Language:  Fair  Akathisia:  No  Handed:  Right  AIMS (if indicated):     Assets:  Communication Skills Physical Health Resilience  ADL's:  Intact  Cognition:  WNL  Sleep:  Number of Hours: 9.75     Have you used any form of tobacco in the last 30 days? (Cigarettes, Smokeless Tobacco, Cigars, and/or Pipes): No  Has this patient used any form of tobacco in the last 30 days? (Cigarettes, Smokeless Tobacco, Cigars, and/or Pipes)  No  Blood Alcohol level:  Lab Results  Component Value Date   ETH <10 06/24/2018   ETH <10 08/19/2017    Metabolic Disorder Labs:  No results found for: HGBA1C, MPG No results found for: PROLACTIN No results found for: CHOL, TRIG, HDL, CHOLHDL, VLDL, LDLCALC  See Psychiatric Specialty Exam and Suicide Risk Assessment completed by Attending Physician prior to discharge.  Discharge destination:  Home  Is patient on multiple antipsychotic therapies at discharge:  No   Has Patient had three or more failed trials of antipsychotic monotherapy by history:  No  Recommended Plan for Multiple Antipsychotic Therapies: NA   Allergies as of 06/26/2018      Reactions   Sulfa Antibiotics Hives      Medication List    STOP taking these medications   baclofen 20 MG tablet Commonly known as:  LIORESAL   belladonna-opium 16.2-30 MG suppository Commonly known as:  B&O SUPPRETTES   QUEtiapine 400 MG tablet Commonly known as:  SEROQUEL     TAKE these medications     Indication  aspirin 81 MG EC tablet Take 81 mg by mouth daily. Swallow whole.   Indication:  Rheumatic Disease causing Vertebrae Inflammation   atorvastatin 20 MG tablet Commonly known as:  LIPITOR Take 20 mg by mouth daily.  Indication:  High Amount of Triglycerides in the Blood   benztropine 0.5 MG tablet Commonly known as:  COGENTIN Take 1 tablet (0.5 mg total) by mouth 2 (two) times daily.  Indication:  Extrapyramidal Reaction caused by Medications  docusate sodium 100 MG capsule Commonly known as:  COLACE Take 100 mg by mouth daily.  Indication:  Constipation   haloperidol 10 MG tablet Commonly known as:  HALDOL Take 1 tablet (10 mg total) by mouth at bedtime.  Indication:  Hypomanic Episode of Bipolar Disorder   ibuprofen 800 MG tablet Commonly known as:  ADVIL,MOTRIN Take 1 tablet (800 mg total) by mouth 3 (three) times daily.  Indication:  Inflammation   losartan 50 MG tablet Commonly known as:  COZAAR Take 50 mg by mouth daily.  Indication:  Left Ventricular Hypertrophy   meloxicam 15 MG tablet Commonly known as:  MOBIC Take 15 mg by mouth daily.  Indication:  Polyarticular Juvenile Idiopathic Arthritis   polyethylene glycol packet Commonly known as:  MIRALAX / GLYCOLAX Take 17 g by mouth daily.  Indication:  Constipation   senna-docusate 8.6-50 MG tablet Commonly known as:  Senokot-S Take 1 tablet by mouth daily.  Indication:  Constipation   topiramate 50 MG tablet Commonly known as:  Topamax Take 1 tablet (50 mg total) by mouth 3 (three) times daily.  Indication:  Fine to Coarse Slow Tremor affecting Head, Hands & Voice      Follow-up Information    [facility] Follow up on 07/05/2018.   Why:  Hospital follow up appointment with Dr. Wynelle Link is Thursday, 4/16  1:00p.  Please bring your current medications and discharge paperwork from this hospitalization.  Contact information: [address] [place] [address] Phone: [phone] Fax: [phone]          Follow-up recommendations: Activity as  tolerated. Diet as recommended by primary care physician. Keep all scheduled follow-up appointments as recommended.   Comments:   Patient is instructed to take all prescribed medications as recommended. Report any side effects or adverse reactions to your outpatient psychiatrist. Patient is instructed to abstain from alcohol and illegal drugs while on prescription medications. In the event of worsening symptoms, patient is instructed to call the crisis hotline, 911, or go to the nearest emergency department for evaluation and treatment.  Signed: Aldean Baker, NP 06/26/2018, 2:24 PM

## 2018-06-26 NOTE — [facility] Group Notes (Signed)
[facility] LCSW Group Therapy Note  Date/Time: 06/26/18, 1100  Type of Therapy/Topic:  Group Therapy:  Feelings about Diagnosis  Participation Level:  Active   Mood: pleasant   Description of Group:    This group will allow patients to explore their thoughts and feelings about diagnoses they have received. Patients will be guided to explore their level of understanding and acceptance of these diagnoses. Facilitator will encourage patients to process their thoughts and feelings about the reactions of others to their diagnosis, and will guide patients in identifying ways to discuss their diagnosis with significant others in their lives. This group will be process-oriented, with patients participating in exploration of their own experiences as well as giving and receiving support and challenge from other group members.   Therapeutic Goals: 1. Patient will demonstrate understanding of diagnosis as evidence by identifying two or more symptoms of the disorder:  2. Patient will be able to express two feelings regarding the diagnosis 3. Patient will demonstrate ability to communicate their needs through discussion and/or role plays  Summary of Patient Progress:Pt fairly intrusive during group today, talking over and interrupting other patients.  Pt made numerous comments, often wandering off topic, and needed to be redirected on all of the above areas.  CSW asked her at one point about the symptoms of the diagnoses we had discussed and pt did not respond in a way that showed much insight into her current hospitalization.        Therapeutic Modalities:   Cognitive Behavioral Therapy Brief Therapy Feelings Identification   Daleen Squibb, LCSW

## 2018-06-26 NOTE — Progress Notes (Signed)
Recreation Therapy Notes  INPATIENT RECREATION TR PLAN  Patient Details Name: Rachel Alvarado MRN: [id number] DOB: September 15, 1965 Today's Date: 06/26/2018  Rec Therapy Plan Is patient appropriate for Therapeutic Recreation?: Yes Treatment times per week: about 3 days  Estimated Length of Stay: 5-7 days TR Treatment/Interventions: Group participation (Comment)  Discharge Criteria Pt will be discharged from therapy if:: Discharged Treatment plan/goals/alternatives discussed and agreed upon by:: Patient/family  Discharge Summary Short term goals set: See patient care plan Short term goals met: Not met Progress toward goals comments: Groups attended Which groups?: Coping skills Reason goals not met: Pt slept through group session. Therapeutic equipment acquired: N/A Reason patient discharged from therapy: Discharge from hospital Pt/family agrees with progress & goals achieved: Yes Date patient discharged from therapy: 06/26/18     Victorino Sparrow, LRT/CTRS  Ria Comment, Lenoard Helbert A 06/26/2018, 11:15 AM

## 2018-06-26 NOTE — Progress Notes (Signed)
Pt discharged to lobby. Pt was stable and appreciative at that time. All papers, samples and prescriptions were given and valuables returned. Verbal understanding expressed. Denies SI/HI and A/VH. Pt given opportunity to express concerns and ask questions.  

## 2018-06-26 NOTE — [facility] Suicide Risk Assessment (Signed)
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)   Total Time spent with patient: 45 minutes  Currently patient is insistent upon discharge she is alert oriented to person place time and situation affect is generally appropriate speech is loud and she is generally allowed presents but she is not acutely psychotic no thoughts of harming self or others and we have discerned that it is generally cocaine that induces her psychosis rather than exacerbating an underlying disorder at least more recently Mental Status Per Nursing Assessment::   On Admission:  Self-harm thoughts, Suicide plan, Belief that plan would result in death  Demographic Factors:  Low socioeconomic status  Loss Factors: Decline in physical health  Historical Factors: Impulsivity  Risk Reduction Factors:   Sense of responsibility to family and Religious beliefs about death  Continued Clinical Symptoms:  Alcohol/Substance Abuse/Dependencies  Cognitive Features That Contribute To Risk:  None    Suicide Risk:  Minimal: No identifiable suicidal ideation.  Patients presenting with no risk factors but with morbid ruminations; may be classified as minimal risk based on the severity of the depressive symptoms  Follow-up Information    [facility] Follow up on 07/05/2018.   Why:  Hospital follow up appointment with Dr. Wynelle Link is Thursday, 4/16  1:00p.  Please bring your current medications and discharge paperwork from this hospitalization.  Contact information: [address] [place] [address] Phone: [phone] Fax: [phone]          Plan Of Care/Follow-up recommendations:  Activity:  full  Ryleigh Esqueda,  MD 06/26/2018, 9:15 AM

## 2018-06-26 NOTE — Progress Notes (Signed)
  [facility] Adult Case Management Discharge Plan :  Will you be returning to the same living situation after discharge:  Yes,  own home At discharge, do you have transportation home?: No. Bus.  No bus fare currently due to virus.  Do you have the ability to pay for your medications: Yes,  medicare/medicaid  Release of information consent forms completed and in the chart;  Patient's signature needed at discharge.  Patient to Follow up at: Follow-up Information    [facility] Follow up on 07/05/2018.   Why:  Hospital follow up appointment with Dr. Wynelle Link is Thursday, 4/16  1:00p.  Please bring your current medications and discharge paperwork from this hospitalization.  Contact information: [address] [place] [address] Phone: [phone] Fax: [phone]          Next level of care provider has access to [facility] [place] Link:no  Safety Planning and Suicide Prevention discussed: No. Pt declined.   Have you used any form of tobacco in the last 30 days? (Cigarettes, Smokeless Tobacco, Cigars, and/or Pipes): No  Has patient been referred to the Quitline?: N/A patient is not a smoker  Patient has been referred for addiction treatment: Pt. refused referral  Lorri Frederick, LCSW 06/26/2018, 9:21 AM

## 2018-06-26 NOTE — Progress Notes (Signed)
Recreation Therapy Notes  Date: 4.7.20 Time: 0950 Location: [address] Dayroom  Group Topic: Coping Skills  Goal Area(s) Addresses:  Patient will identify positive coping skills. Patient will identify the importance of coping skills. Patient will identify benefit of using coping skills post d/c.  Behavioral Response:  None  Intervention: Worksheet, Music  Activity:  Coping A to Z.  Patients were to identify a coping skill for each letter of the alphabet.  Patients would then share with the group their top 6 six coping skills.  Education: Pharmacologist, Building control surveyor.   Education Outcome: Acknowledges understanding/In group clarification offered/Needs additional education.   Clinical Observations/Feedback:  Pt slept through group.     Rachel Alvarado, LRT/CTRS     Rachel Alvarado A 06/26/2018 10:52 AM

## 2018-06-26 NOTE — Plan of Care (Signed)
Pt slept through coping skills group session.   Caroll Rancher, LRT/CTRS

## 2018-06-26 NOTE — Progress Notes (Signed)
Pt attended goals/orientation group this morning. Pt shared her goal with peers. Pt goal for today is to prepare for discharge. Staff discussed the rules and schedule for the unit with patients.

## 2018-08-19 ENCOUNTER — Encounter (HOSPITAL_COMMUNITY): Payer: Self-pay

## 2018-08-19 ENCOUNTER — Other Ambulatory Visit: Payer: Self-pay

## 2018-08-19 ENCOUNTER — Emergency Department (HOSPITAL_COMMUNITY)
Admission: EM | Admit: 2018-08-19 | Discharge: 2018-08-19 | Disposition: A | Discharge status: Home / Self Care | Payer: Medicare HMO | Attending: Emergency Medicine | Admitting: Emergency Medicine

## 2018-08-19 ENCOUNTER — Emergency Department (HOSPITAL_COMMUNITY): Payer: Medicare HMO

## 2018-08-19 DIAGNOSIS — M79602 Pain in left arm: Secondary | ICD-10-CM | POA: Insufficient documentation

## 2018-08-19 DIAGNOSIS — Z7982 Long term (current) use of aspirin: Secondary | ICD-10-CM | POA: Diagnosis not present

## 2018-08-19 DIAGNOSIS — I10 Essential (primary) hypertension: Secondary | ICD-10-CM | POA: Insufficient documentation

## 2018-08-19 DIAGNOSIS — Z87891 Personal history of nicotine dependence: Secondary | ICD-10-CM | POA: Diagnosis not present

## 2018-08-19 DIAGNOSIS — J449 Chronic obstructive pulmonary disease, unspecified: Secondary | ICD-10-CM | POA: Insufficient documentation

## 2018-08-19 DIAGNOSIS — Z79899 Other long term (current) drug therapy: Secondary | ICD-10-CM | POA: Insufficient documentation

## 2018-08-19 LAB — CBC WITH DIFFERENTIAL/PLATELET
Abs Immature Granulocytes: 0.01 10*3/uL (ref 0.00–0.07)
Basophils Absolute: 0 10*3/uL (ref 0.0–0.1)
Basophils Relative: 0 %
Eosinophils Absolute: 0.2 10*3/uL (ref 0.0–0.5)
Eosinophils Relative: 3 %
HCT: 36.9 % (ref 36.0–46.0)
Hemoglobin: 11.6 g/dL — ABNORMAL LOW (ref 12.0–15.0)
Immature Granulocytes: 0 %
Lymphocytes Relative: 34 %
Lymphs Abs: 1.7 10*3/uL (ref 0.7–4.0)
MCH: 30.3 pg (ref 26.0–34.0)
MCHC: 31.4 g/dL (ref 30.0–36.0)
MCV: 96.3 fL (ref 80.0–100.0)
Monocytes Absolute: 0.4 10*3/uL (ref 0.1–1.0)
Monocytes Relative: 8 %
Neutro Abs: 2.7 10*3/uL (ref 1.7–7.7)
Neutrophils Relative %: 55 %
Platelets: 227 10*3/uL (ref 150–400)
RBC: 3.83 MIL/uL — ABNORMAL LOW (ref 3.87–5.11)
RDW: 13.2 % (ref 11.5–15.5)
WBC: 5 10*3/uL (ref 4.0–10.5)
nRBC: 0 % (ref 0.0–0.2)

## 2018-08-19 LAB — RAPID URINE DRUG SCREEN, HOSP PERFORMED
Amphetamines: NOT DETECTED
Barbiturates: NOT DETECTED
Benzodiazepines: NOT DETECTED
Cocaine: NOT DETECTED
Opiates: NOT DETECTED
Tetrahydrocannabinol: NOT DETECTED

## 2018-08-19 LAB — BASIC METABOLIC PANEL
Anion gap: 10 (ref 5–15)
BUN: 10 mg/dL (ref 6–20)
CO2: 22 mmol/L (ref 22–32)
Calcium: 8.8 mg/dL — ABNORMAL LOW (ref 8.9–10.3)
Chloride: 108 mmol/L (ref 98–111)
Creatinine, Ser: 0.67 mg/dL (ref 0.44–1.00)
GFR calc Af Amer: >60 mL/min (ref >60)
GFR calc non Af Amer: >60 mL/min (ref >60)
Glucose, Bld: 169 mg/dL — ABNORMAL HIGH (ref 70–99)
Potassium: 3.4 mmol/L — ABNORMAL LOW (ref 3.5–5.1)
Sodium: 140 mmol/L (ref 135–145)

## 2018-08-19 LAB — URINALYSIS, ROUTINE W REFLEX MICROSCOPIC
Bilirubin Urine: NEGATIVE
Glucose, UA: NEGATIVE mg/dL
Hgb urine dipstick: NEGATIVE
Ketones, ur: NEGATIVE mg/dL
Leukocytes,Ua: NEGATIVE
Nitrite: NEGATIVE
Protein, ur: NEGATIVE mg/dL
Specific Gravity, Urine: 1.026 (ref 1.005–1.030)
pH: 5 (ref 5.0–8.0)

## 2018-08-19 LAB — TROPONIN I: Troponin I: <0.03 ng/mL (ref <0.03)

## 2018-08-19 MED ORDER — ACETAMINOPHEN 325 MG PO TABS
650.0000 mg | ORAL_TABLET | Freq: Once | ORAL | Status: AC
  Administered 2018-08-19: 15:00:00 650 mg via ORAL
  Filled 2018-08-19: qty 2

## 2018-08-19 NOTE — ED Notes (Signed)
This RN acting as Statistician and asked pt if she would like for me to update any family/friends. Pt declined and states she is able to keep family informed.

## 2018-08-19 NOTE — ED Triage Notes (Signed)
1 week hx of pain to L arm x 1 week, pressure to chest.  States 6 months ago was told she had blocked arteries, and her arm is aching from the blocked arteries.  States she feel like she is not getting enough oxygen with no N/V/D or daiphoresis.

## 2018-08-19 NOTE — Discharge Instructions (Addendum)
You have been seen today for arm pain. Please read and follow all provided instructions. Return to the emergency room for worsening condition or new concerning symptoms.    1. Medications:  Continue usual home medications.  Is important that you take your home medications and do not miss any doses. Take medications as prescribed. Please review all of the medicines and only take them if you do not have an allergy to them.  2. Treatment: rest, drink plenty of fluids 3. Follow Up: Please follow up with your primary doctor in 2-5 days for discussion of your diagnoses and further evaluation after today's visit; Call today to arrange your follow up.  If you do not have a primary care doctor use the resource guide provided to find one;   It is also a possibility that you have an allergic reaction to any of the medicines that you have been prescribed - Everybody reacts differently to medications and while MOST people have no trouble with most medicines, you may have a reaction such as nausea, vomiting, rash, swelling, shortness of breath. If this is the case, please stop taking the medicine immediately and contact your physician.  ?

## 2018-08-19 NOTE — ED Provider Notes (Signed)
[facility] EMERGENCY DEPARTMENT Provider Note   CSN: [id number] Arrival date & time: 08/19/18  1150    History   Chief Complaint No chief complaint on file.   HPI Rachel Alvarado is a 53 y.o. female with history of COPD, schizophrenia, hypertension, presents emergency department today with chief complaint of left arm pain x1 week.  She describes the pain is located in her left upper arm and is throbbing.  The pain is better at rest and worse with movement of her neck. She rates pain 10/10 in severity.  She states 6 months ago she was told by her doctor she has blocked arteries in her neck and was started on aspirin and Lipitor.  She has history of left-sided chest pressure but states that is not why she is here today, she says "my chest is fine. It's this arm."  She last used cocaine 3 weeks ago. Reports compliance with her daily meds. She denies any fever, chills, cough, weakness, numbness, recent fall or trauma.  History provided by patient with additional history obtained from chart review.        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/05/2017   Bilateral low back pain without sciatica 07/02/2015   Knee pain, bilateral 07/02/2015   Breast pain, right 11/05/2013   Dizziness and giddiness 05/28/2012   Screening for STDs (sexually transmitted diseases) 04/24/2012   Schizophrenia (HCC)  04/03/2012   History of sleep apnea 04/03/2012   Arthritis of both knees 04/03/2012   Anxiety and depression 04/03/2012   Musculoskeletal chest pain 04/03/2012   Preventative health care 04/03/2012   Morbid obesity with BMI of 45.0-49.9, adult (HCC) 04/03/2012    Past Surgical History:  Procedure Laterality Date   CESAREAN SECTION     CHOLECYSTECTOMY     PARTIAL HYSTERECTOMY       OB History   No obstetric history on file.      Home Medications    Prior to Admission medications   Medication Sig Start Date End Date Taking? Authorizing Provider  aspirin 81 MG EC tablet Take 81 mg by mouth daily. Swallow whole.   Yes [provider]  atorvastatin (LIPITOR) 20 MG tablet Take 20 mg by mouth daily. 03/12/18  Yes [provider]  benztropine (COGENTIN) 0.5 MG tablet Take 1 tablet (0.5 mg total) by mouth 2 (two) times daily. 06/26/18  Yes Malvin Johns, MD  docusate sodium (COLACE) 100 MG capsule Take 100 mg by mouth daily.    Yes [provider]  losartan (COZAAR) 50 MG tablet Take 50 mg by mouth daily. 03/12/18  Yes [provider]  meloxicam (MOBIC) 15 MG tablet Take 15 mg by mouth daily. 03/13/18  Yes [provider]  haloperidol (HALDOL) 10 MG tablet Take 1 tablet (10 mg total) by mouth at bedtime. Patient not taking: Reported  on 08/19/2018 06/26/18   Malvin JohnsFarah, Brian, MD  ibuprofen (ADVIL,MOTRIN) 800 MG tablet Take 1 tablet (800 mg total) by mouth 3 (three) times daily. Patient not taking: Reported on 08/19/2018 02/11/18   Fayrene Helperran, Bowie, PA-C  polyethylene glycol [facility](MIRALAX / Ethelene HalGLYCOLAX) packet Take 17 g by mouth daily. Patient not taking: Reported on 06/24/2018 04/16/16   Gerhard MunchLockwood, Robert, MD  senna-docusate (SENOKOT-S) 8.6-50 MG tablet Take 1 tablet by mouth daily. Patient not taking: Reported on 06/24/2018 04/16/16   Gerhard MunchLockwood, Robert, MD  topiramate (TOPAMAX) 50 MG tablet Take 1 tablet (50 mg total) by mouth 3 (three) times daily. Patient not  taking: Reported on 08/19/2018 06/26/18 06/26/19  Malvin JohnsFarah, Brian, MD    Family History Family History  Problem Relation Age of Onset   Alcohol abuse Father    Heart disease Father    Diabetes Mellitus II Father    Diabetes Father    Hypertension Father    Osteoporosis Mother    Heart disease Mother    Diabetes Mellitus II Brother    Stroke Sister     Social History Social History   Tobacco Use   Smoking status: Former Smoker    Packs/day: 0.02    Types: Cigarettes    Last attempt to quit: 02/03/2012    Years since quitting: 6.5   Smokeless tobacco: Never Used  Substance Use Topics   Alcohol use: No   Drug use: No    Types: Cocaine, Marijuana    Comment: cocaine in a "blue moon, marijuana twice a wk", pt states she stopped using last month     Allergies   Sulfa antibiotics   Review of Systems Review of Systems  Constitutional: Negative for chills and fever.  HENT: Negative for congestion, ear discharge, ear pain, sinus pressure, sinus pain and sore throat.   Eyes: Negative for pain and redness.  Respiratory: Negative for cough, chest tightness, shortness of breath and wheezing.   Cardiovascular: Positive for chest pain. Negative for palpitations and leg swelling.  Gastrointestinal: Negative for abdominal pain, constipation, diarrhea, nausea and vomiting.  Genitourinary: Negative for dysuria and hematuria.  Musculoskeletal: Positive for arthralgias. Negative for back pain and neck pain.  Skin: Negative for wound.  Neurological: Negative for weakness, numbness and headaches.     Physical Exam Updated Vital Signs BP 130/89 (BP Location: Right Arm)    Pulse 99    Temp 98.7 F (37.1 C) (Oral)    Resp 14    Ht 5\' 4"  (1.626 m)    Wt 127 kg    SpO2 99%    BMI 48.06 kg/m   Physical Exam Vitals signs and nursing note reviewed.  Constitutional:      Appearance: She is obese. She is not ill-appearing.     Comments: Pt is resting comfortably on stretcher. She  is in no acute distress.  HENT:     Head: Normocephalic and atraumatic.     Right Ear: Tympanic membrane and external ear normal.     Left Ear: Tympanic membrane and external ear normal.     Nose: Nose normal.     Mouth/Throat:     Mouth: Mucous membranes are moist.     Pharynx: Oropharynx is clear.  Eyes:     General: No scleral icterus.       Right eye: No discharge.        Left eye: No discharge.     Extraocular Movements: Extraocular movements intact.     Conjunctiva/sclera: Conjunctivae normal.  Pupils: Pupils are equal, round, and reactive to light.  Neck:     Musculoskeletal: Normal range of motion.     Vascular: No JVD.  Cardiovascular:     Rate and Rhythm: Normal rate and regular rhythm.     Pulses: Normal pulses.          Radial pulses are 2+ on the right side and 2+ on the left side.     Heart sounds: Normal heart sounds.  Pulmonary:     Comments: Lungs clear to auscultation in all fields. Symmetric chest rise. No wheezing, rales, or rhonchi. Chest:     Chest wall: No tenderness.  Abdominal:     Comments: Abdomen is soft, non-distended, and non-tender in all quadrants. No rigidity, no guarding. No peritoneal signs.  Musculoskeletal: Normal range of motion.     Right lower leg: No edema.     Left lower leg: No edema.     Comments: No bony tenderness to left shoulder, elbow, and wrist. Full ROM of left shoulder, elbow, wrist. negative empty can test. No swelling, erythema or ecchymosis present. No step-off, crepitus, or deformity appreciated. 5/5 muscle strength of LUE. 2+ radial pulse, sensation intact and all compartments soft.   Skin:    General: Skin is warm and dry.     Capillary Refill: Capillary refill takes less than 2 seconds.  Neurological:     Mental Status: She is oriented to person, place, and time.     GCS: GCS eye subscore is 4. GCS verbal subscore is 5. GCS motor subscore is 6.     Comments: Fluent speech, no facial droop.  Psychiatric:         Mood and Affect: Affect normal.        Speech: Speech normal.        Behavior: Behavior normal.        Thought Content: Thought content normal.      ED Treatments / Results  Labs (all labs ordered are listed, but only abnormal results are displayed) Labs Reviewed  URINALYSIS, ROUTINE W REFLEX MICROSCOPIC - Abnormal; Notable for the following components:      Result Value   APPearance HAZY (*)    All other components within normal limits  BASIC METABOLIC PANEL - Abnormal; Notable for the following components:   Potassium 3.4 (*)    Glucose, Bld 169 (*)    Calcium 8.8 (*)    All other components within normal limits  CBC WITH DIFFERENTIAL/PLATELET - Abnormal; Notable for the following components:   RBC 3.83 (*)    Hemoglobin 11.6 (*)    All other components within normal limits  RAPID URINE DRUG SCREEN, HOSP PERFORMED  TROPONIN I    EKG EKG Interpretation  Date/Time:  Sunday Aug 19 2018 12:27:45 EDT Ventricular Rate:  91 PR Interval:  162 QRS Duration: 92 QT Interval:  378 QTC Calculation: 464 R Axis:   27 Text Interpretation:  Normal sinus rhythm Low voltage QRS Cannot rule out Anterior infarct , age undetermined Abnormal ECG No significant change was found Confirmed by Azalia Bilis ([id number]) on 08/19/2018 2:15:42 PM   Radiology PORTABLE CHEST 1 VIEW  COMPARISON: June 24, 2018. FINDINGS:The heart size and mediastinal contours are within normal limits. Both lungs are clear. The visualized skeletal structures are unremarkable.IMPRESSION: No active disease. Electronically SignedBy: Gerome Sam III M.D On: 08/19/2018 13:57   Procedures Procedures (including critical care time)  Medications Ordered in ED Medications  acetaminophen (TYLENOL) tablet 650 mg (650  mg Oral Given 08/19/18 1440)     Initial Impression / Assessment and Plan / ED Course  I have reviewed the triage vital signs and the nursing notes.  Pertinent labs & imaging results that were available  during my care of the patient were reviewed by me and considered in my medical decision making (see chart for details).  Pt is well appearing. She is in no acute distress. She is denying chest pain and instead stating her left arm has been hurting for 1 week. Exam without obvious deformity or open wounds to left arm. ROM intact. Left upper extremity is non tender to palpation. NVI distally.  Chest pain is reproducible on exam. Trop negative. CBC and BMP unremarkable. EKG viewed by me is without acute abnormalities. UA without obvious signs of infection. UDS negative. Chest xray viewed by me without infiltrate, no cardiopulmonary disease seen, agree with radiologist reading. Pt given PO tylenol and ice pack for pain. On reassessment she reports pain has significantly improved. Chest pain is not likely of cardiac or pulmonary etiology d/t presentation, likely MSK in nature. Patient is hemodynamically stable, in NAD, and able to ambulate in the ED. Evaluation does not show pathology that would require ongoing emergent intervention or inpatient treatment. I explained the diagnosis to the patient. Patient is comfortable with above plan and is stable for discharge at this time. All questions were answered prior to disposition. Strict return precautions for returning to the ED were discussed. Encouraged follow up with PCP. Findings and plan of care discussed with supervising physician Dr. Patria Mane.    This note was prepared using Dragon voice recognition software and may include unintentional dictation errors due to the inherent limitations of voice recognition software.    Final Clinical Impressions(s) / ED Diagnoses   Final diagnoses:  Pain of left upper extremity    ED Discharge Orders    None       Kathyrn Lass 08/23/18 1610    Azalia Bilis, MD 08/23/18 [phone]

## 2018-08-31 DIAGNOSIS — I6523 Occlusion and stenosis of bilateral carotid arteries: Secondary | ICD-10-CM | POA: Diagnosis not present

## 2019-01-31 DIAGNOSIS — Z1159 Encounter for screening for other viral diseases: Secondary | ICD-10-CM | POA: Diagnosis not present

## 2019-01-31 DIAGNOSIS — M25512 Pain in left shoulder: Secondary | ICD-10-CM | POA: Diagnosis not present

## 2019-01-31 DIAGNOSIS — Z7251 High risk heterosexual behavior: Secondary | ICD-10-CM | POA: Diagnosis not present

## 2019-01-31 DIAGNOSIS — Z114 Encounter for screening for human immunodeficiency virus [HIV]: Secondary | ICD-10-CM | POA: Diagnosis not present

## 2019-01-31 DIAGNOSIS — I6523 Occlusion and stenosis of bilateral carotid arteries: Secondary | ICD-10-CM | POA: Diagnosis not present

## 2019-03-04 ENCOUNTER — Other Ambulatory Visit: Payer: Self-pay | Admitting: Internal Medicine

## 2019-03-04 DIAGNOSIS — Z1231 Encounter for screening mammogram for malignant neoplasm of breast: Secondary | ICD-10-CM

## 2019-04-22 ENCOUNTER — Ambulatory Visit
Admission: RE | Admit: 2019-04-22 | Discharge: 2019-04-22 | Disposition: A | Discharge status: Home / Self Care | Payer: Medicare HMO | Source: Ambulatory Visit | Attending: Internal Medicine | Admitting: Internal Medicine

## 2019-04-22 ENCOUNTER — Other Ambulatory Visit: Payer: Self-pay

## 2019-04-22 DIAGNOSIS — Z1231 Encounter for screening mammogram for malignant neoplasm of breast: Secondary | ICD-10-CM | POA: Diagnosis not present

## 2019-05-02 DIAGNOSIS — I6523 Occlusion and stenosis of bilateral carotid arteries: Secondary | ICD-10-CM | POA: Diagnosis not present

## 2019-05-02 DIAGNOSIS — R400 Somnolence: Secondary | ICD-10-CM | POA: Diagnosis not present

## 2019-05-02 DIAGNOSIS — R635 Abnormal weight gain: Secondary | ICD-10-CM | POA: Diagnosis not present

## 2019-05-02 DIAGNOSIS — Z6841 Body Mass Index (BMI) 40.0 and over, adult: Secondary | ICD-10-CM | POA: Diagnosis not present

## 2019-05-02 DIAGNOSIS — Z79899 Other long term (current) drug therapy: Secondary | ICD-10-CM | POA: Diagnosis not present

## 2019-05-02 DIAGNOSIS — E1151 Type 2 diabetes mellitus with diabetic peripheral angiopathy without gangrene: Secondary | ICD-10-CM | POA: Diagnosis not present

## 2019-05-02 DIAGNOSIS — I739 Peripheral vascular disease, unspecified: Secondary | ICD-10-CM | POA: Diagnosis not present

## 2019-05-02 DIAGNOSIS — J449 Chronic obstructive pulmonary disease, unspecified: Secondary | ICD-10-CM | POA: Diagnosis not present

## 2019-05-15 DIAGNOSIS — N3281 Overactive bladder: Secondary | ICD-10-CM | POA: Diagnosis not present

## 2019-06-14 DIAGNOSIS — R0989 Other specified symptoms and signs involving the circulatory and respiratory systems: Secondary | ICD-10-CM | POA: Diagnosis not present

## 2019-06-14 DIAGNOSIS — M79604 Pain in right leg: Secondary | ICD-10-CM | POA: Diagnosis not present

## 2019-06-14 DIAGNOSIS — E559 Vitamin D deficiency, unspecified: Secondary | ICD-10-CM | POA: Diagnosis not present

## 2019-06-14 DIAGNOSIS — Z1159 Encounter for screening for other viral diseases: Secondary | ICD-10-CM | POA: Diagnosis not present

## 2019-06-14 DIAGNOSIS — R0602 Shortness of breath: Secondary | ICD-10-CM | POA: Diagnosis not present

## 2019-06-14 DIAGNOSIS — R5383 Other fatigue: Secondary | ICD-10-CM | POA: Diagnosis not present

## 2019-06-14 DIAGNOSIS — Z0000 Encounter for general adult medical examination without abnormal findings: Secondary | ICD-10-CM | POA: Diagnosis not present

## 2019-06-14 DIAGNOSIS — E119 Type 2 diabetes mellitus without complications: Secondary | ICD-10-CM | POA: Diagnosis not present

## 2019-06-14 DIAGNOSIS — R1084 Generalized abdominal pain: Secondary | ICD-10-CM | POA: Diagnosis not present

## 2019-06-14 DIAGNOSIS — E1165 Type 2 diabetes mellitus with hyperglycemia: Secondary | ICD-10-CM | POA: Diagnosis not present

## 2019-06-14 DIAGNOSIS — J449 Chronic obstructive pulmonary disease, unspecified: Secondary | ICD-10-CM | POA: Diagnosis not present

## 2019-06-14 DIAGNOSIS — M79605 Pain in left leg: Secondary | ICD-10-CM | POA: Diagnosis not present

## 2020-03-15 ENCOUNTER — Encounter (HOSPITAL_COMMUNITY): Payer: Self-pay | Admitting: Emergency Medicine

## 2020-03-15 ENCOUNTER — Emergency Department (HOSPITAL_COMMUNITY): Payer: Medicare HMO

## 2020-03-15 ENCOUNTER — Other Ambulatory Visit: Payer: Self-pay

## 2020-03-15 ENCOUNTER — Emergency Department (HOSPITAL_COMMUNITY)
Admission: EM | Admit: 2020-03-15 | Discharge: 2020-03-15 | Disposition: A | Discharge status: Home / Self Care | Payer: Medicare HMO | Attending: Emergency Medicine | Admitting: Emergency Medicine

## 2020-03-15 DIAGNOSIS — J449 Chronic obstructive pulmonary disease, unspecified: Secondary | ICD-10-CM | POA: Diagnosis not present

## 2020-03-15 DIAGNOSIS — I10 Essential (primary) hypertension: Secondary | ICD-10-CM | POA: Insufficient documentation

## 2020-03-15 DIAGNOSIS — Z87891 Personal history of nicotine dependence: Secondary | ICD-10-CM | POA: Insufficient documentation

## 2020-03-15 DIAGNOSIS — Z79899 Other long term (current) drug therapy: Secondary | ICD-10-CM | POA: Diagnosis not present

## 2020-03-15 DIAGNOSIS — R202 Paresthesia of skin: Secondary | ICD-10-CM | POA: Diagnosis not present

## 2020-03-15 LAB — CBC WITH DIFFERENTIAL/PLATELET
Abs Immature Granulocytes: 0.01 10*3/uL (ref 0.00–0.07)
Basophils Absolute: 0 10*3/uL (ref 0.0–0.1)
Basophils Relative: 0 %
Eosinophils Absolute: 0.1 10*3/uL (ref 0.0–0.5)
Eosinophils Relative: 2 %
HCT: 39 % (ref 36.0–46.0)
Hemoglobin: 12.2 g/dL (ref 12.0–15.0)
Immature Granulocytes: 0 %
Lymphocytes Relative: 36 %
Lymphs Abs: 2.4 10*3/uL (ref 0.7–4.0)
MCH: 31.3 pg (ref 26.0–34.0)
MCHC: 31.3 g/dL (ref 30.0–36.0)
MCV: 100 fL (ref 80.0–100.0)
Monocytes Absolute: 0.5 10*3/uL (ref 0.1–1.0)
Monocytes Relative: 7 %
Neutro Abs: 3.8 10*3/uL (ref 1.7–7.7)
Neutrophils Relative %: 55 %
Platelets: 257 10*3/uL (ref 150–400)
RBC: 3.9 MIL/uL (ref 3.87–5.11)
RDW: 13.2 % (ref 11.5–15.5)
WBC: 6.8 10*3/uL (ref 4.0–10.5)
nRBC: 0 % (ref 0.0–0.2)

## 2020-03-15 LAB — BASIC METABOLIC PANEL
Anion gap: 10 (ref 5–15)
BUN: 12 mg/dL (ref 6–20)
CO2: 25 mmol/L (ref 22–32)
Calcium: 8.7 mg/dL — ABNORMAL LOW (ref 8.9–10.3)
Chloride: 107 mmol/L (ref 98–111)
Creatinine, Ser: 0.78 mg/dL (ref 0.44–1.00)
GFR, Estimated: >60 mL/min (ref >60)
Glucose, Bld: 134 mg/dL — ABNORMAL HIGH (ref 70–99)
Potassium: 4 mmol/L (ref 3.5–5.1)
Sodium: 142 mmol/L (ref 135–145)

## 2020-03-15 MED ORDER — GADOBUTROL 1 MMOL/ML IV SOLN: 10.0000 mL | Freq: Once | INTRAVENOUS | Status: DC | PRN

## 2020-03-15 NOTE — ED Notes (Signed)
Patient transported to MRI 

## 2020-03-15 NOTE — ED Notes (Signed)
Attempted to export EKG again. Belfi MD made aware.

## 2020-03-15 NOTE — ED Provider Notes (Signed)
[facility]-EMERGENCY DEPT Provider Note   CSN: [id number] Arrival date & time: 03/15/20  1651     History Chief Complaint  Patient presents with  . Numbness    Rachel Alvarado is a 54 y.o. female.  Patient is a 54 year old female with a history of schizophrenia, hypertension, COPD and sleep apnea as well as obesity who presents with numbness in her left hand and left leg.  She describes numbness in her left hand.  She thinks it feels more on the outside digits of the left hand.  She says she does feel the numbness up into her arm though.  She feels like she is having a little bit of weakness in the arm.  This is been going on for about 6 days.  She also has some numbness in her left leg.  Initially said the numbness was just around her knee but then she said it was actually from her foot up to her knee.  She does not report any weakness in the leg.  She felt that maybe her speech was a little slurred but she was not sure.  She has no difficulty or change in her ambulation.  No change in her vision.  No associated neck or back pain.  She says she has had some intermittent numbness in her hand before but usually does not last this long.        Past Medical History:  Diagnosis Date  . Anemia   . Anxiety   . Arthritis   . Chronic back pain   . COPD (chronic obstructive pulmonary disease) (HCC)   . Depression   . Hypertension   . Panic attacks   . Schizophrenia (HCC)   . Sleep apnea     Patient Active Problem List   Diagnosis Date Noted  . Schizoaffective disorder, bipolar type (HCC)   . 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 Cocaine abuse (HCC) 06/24/2018  . 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 Bilateral low back pain without sciatica 07/02/2015  . Knee pain, bilateral 07/02/2015  . Breast pain,  right 11/05/2013  . Dizziness and giddiness 05/28/2012  . Screening for STDs (sexually transmitted diseases) 04/24/2012  . Schizophrenia (HCC) 04/03/2012  . History of sleep apnea 04/03/2012  . Arthritis of both knees 04/03/2012  . Anxiety and depression 04/03/2012  . Musculoskeletal chest pain 04/03/2012  . Preventative health care 04/03/2012  . Morbid obesity with BMI of 45.0-49.9, adult (HCC) 04/03/2012    Past Surgical History:  Procedure Laterality Date  . CESAREAN SECTION    . CHOLECYSTECTOMY    . PARTIAL HYSTERECTOMY       OB History   No obstetric history on file.     Family History  Problem Relation Age of Onset  . Alcohol abuse Father   . Heart disease Father   . Diabetes Mellitus II Father   . Diabetes Father   . Hypertension Father   . Osteoporosis Mother   . Heart disease Mother   . Diabetes Mellitus II Brother   . Stroke Sister     Social History   Tobacco Use  . Smoking status: Former Smoker    Packs/day: 0.02    Types: Cigarettes    Quit date: 02/03/2012    Years since quitting: 8.1  . Smokeless tobacco: Never Used  Substance Use Topics  .  Alcohol use: No  . Drug use: No    Types: Cocaine, Marijuana    Comment: cocaine in a "blue moon, marijuana twice a wk", pt states she stopped using last month    Home Medications Prior to Admission medications   Medication Sig Start Date End Date Taking? Authorizing Provider  aspirin 81 MG EC tablet Take 81 mg by mouth daily. Swallow whole.    [provider]  atorvastatin (LIPITOR) 20 MG tablet Take 20 mg by mouth daily. 03/12/18   [provider]  benztropine (COGENTIN) 0.5 MG tablet Take 1 tablet (0.5 mg total) by mouth 2 (two) times daily. 06/26/18   Malvin Johns, MD  docusate sodium (COLACE) 100 MG capsule Take 100 mg by mouth daily.     [provider]  haloperidol (HALDOL) 10 MG tablet Take 1 tablet (10 mg total) by mouth at bedtime. Patient not taking: Reported on  08/19/2018 06/26/18   Malvin Johns, MD  ibuprofen (ADVIL,MOTRIN) 800 MG tablet Take 1 tablet (800 mg total) by mouth 3 (three) times daily. Patient not taking: Reported on 08/19/2018 02/11/18   Fayrene Helper, PA-C  losartan (COZAAR) 50 MG tablet Take 50 mg by mouth daily. 03/12/18   [provider]  meloxicam (MOBIC) 15 MG tablet Take 15 mg by mouth daily. 03/13/18   [provider]  polyethylene glycol (MIRALAX / GLYCOLAX) packet Take 17 g by mouth daily. Patient not taking: Reported on 06/24/2018 04/16/16   Gerhard Munch, MD  senna-docusate (SENOKOT-S) 8.6-50 MG tablet Take 1 tablet by mouth daily. Patient not taking: Reported on 06/24/2018 04/16/16   Gerhard Munch, MD  topiramate (TOPAMAX) 50 MG tablet Take 1 tablet (50 mg total) by mouth 3 (three) times daily. Patient not taking: Reported on 08/19/2018 06/26/18 06/26/19  Malvin Johns, MD    Allergies    Sulfa antibiotics  Review of Systems   Review of Systems  Constitutional: Negative for chills, diaphoresis, fatigue and fever.  HENT: Negative for congestion, rhinorrhea and sneezing.   Eyes: Negative.   Respiratory: Negative for cough, chest tightness and shortness of breath.   Cardiovascular: Negative for chest pain and leg swelling.  Gastrointestinal: Negative for abdominal pain, blood in stool, diarrhea, nausea and vomiting.  Genitourinary: Negative for difficulty urinating, flank pain, frequency and hematuria.  Musculoskeletal: Negative for arthralgias and back pain.  Skin: Negative for rash.  Neurological: Positive for weakness and numbness. Negative for dizziness, speech difficulty and headaches.    Physical Exam Updated Vital Signs BP (!) 142/97   Pulse 93   Temp 98.5 F (36.9 C) (Oral)   Resp (!) 22   SpO2 99%   Physical Exam Constitutional:      Appearance: She is well-developed and well-nourished.  HENT:     Head: Normocephalic and atraumatic.  Eyes:     Pupils: Pupils are equal, round, and reactive  to light.  Cardiovascular:     Rate and Rhythm: Normal rate and regular rhythm.     Heart sounds: Normal heart sounds.  Pulmonary:     Effort: Pulmonary effort is normal. No respiratory distress.     Breath sounds: Normal breath sounds. No wheezing or rales.  Chest:     Chest wall: No tenderness.  Abdominal:     General: Bowel sounds are normal.     Palpations: Abdomen is soft.     Tenderness: There is no abdominal tenderness. There is no guarding or rebound.  Musculoskeletal:  General: No edema. Normal range of motion.     Cervical back: Normal range of motion and neck supple.  Lymphadenopathy:     Cervical: No cervical adenopathy.  Skin:    General: Skin is warm and dry.     Findings: No rash.  Neurological:     Mental Status: She is alert and oriented to person, place, and time.     Comments: Cranial nerves II through XII grossly intact, motor 5 out of 5 all extremities, sensation is diminished to light touch in the ulnar side of the left arm.  She also have some diminished sensation light touch in the left leg, seems like it is more on the outside of the leg but she cannot really tell.  She has a slightly shuffling type gait which is normal for her per her report.  Psychiatric:        Mood and Affect: Mood and affect normal.     ED Results / Procedures / Treatments   Labs (all labs ordered are listed, but only abnormal results are displayed) Labs Reviewed  BASIC METABOLIC PANEL - Abnormal; Notable for the following components:      Result Value   Glucose, Bld 134 (*)    Calcium 8.7 (*)    All other components within normal limits  CBC WITH DIFFERENTIAL/PLATELET    EKG None   ED ECG REPORT   Date: 03/15/2020  Rate: 93  Rhythm: normal sinus rhythm  QRS Axis: normal  Intervals: normal  ST/T Wave abnormalities: normal  Conduction Disutrbances:none  Narrative Interpretation:   Old EKG Reviewed: unchanged  I have personally reviewed the EKG tracing and  agree with the computerized printout as noted.   Radiology MR BRAIN WO CONTRAST  Result Date: 03/15/2020 CLINICAL DATA:  Left upper extremity numbness for 6 days. EXAM: MRI HEAD WITHOUT CONTRAST TECHNIQUE: Multiplanar, multiecho pulse sequences of the brain and surrounding structures were obtained without intravenous contrast. COMPARISON:  Head CT 02/05/2012 FINDINGS: Brain: There is no evidence of an acute infarct, intracranial hemorrhage, mass, midline shift, or extra-axial fluid collection. The ventricles and sulci are within normal limits for age. There is a single 8 mm focus of T2 FLAIR hyperintensity in the posterior left frontal white matter at the level of the centrum semiovale. The brain is otherwise normal in signal. Vascular: Major intracranial vascular flow voids are preserved. Skull and upper cervical spine: Unremarkable bone marrow signal. Sinuses/Orbits: Old left orbital floor fracture. Paranasal sinuses and mastoid air cells are clear. Other: None. IMPRESSION: 1. No acute intracranial abnormality. 2. Single small focus of nonspecific gliosis in the left frontal white matter. Electronically Signed   By: Sebastian AcheAllen  Grady M.D.   On: 03/15/2020 21:42    Procedures Procedures (including critical care time)  Medications Ordered in ED Medications  gadobutrol (GADAVIST) 1 MMOL/ML injection 10 mL (has no administration in time range)    ED Course  I have reviewed the triage vital signs and the nursing notes.  Pertinent labs & imaging results that were available during my care of the patient were reviewed by me and considered in my medical decision making (see chart for details).    MDM Rules/Calculators/A&P                          Patient is a 54 year old female who presents with some numbness and tingling to her left hand and left foot. Seems to be more peripheral but she was difficult  to get a story out of and she had reported some questionable speech deficits and some questionable  weakness in the hand. Given this I did do an MRI which showed no evidence of acute abnormality. No stroke. Her labs are nonconcerning. I feel that this is likely paresthesias from a peripheral source. She will follow up with her PCP. Return precautions were given. Her EKG does show a sinus rhythm. Final Clinical Impression(s) / ED Diagnoses Final diagnoses:  Paresthesias    Rx / DC Orders ED Discharge Orders    None       Rolan Bucco, MD 03/15/20 2234

## 2020-03-15 NOTE — Discharge Instructions (Addendum)
Follow-up with your primary care doctor.  Return here as needed for any worsening symptoms. 

## 2020-03-15 NOTE — ED Triage Notes (Signed)
Patient reports L 4th and 5th digit numbness and tingling x6 days. No deficits noted. Patient AOx4. Denies any other symptoms. Patient has hx of schizophrenia. EMS reports patient appears manic and is continuously talking. Patient denies any other symptoms. VS WDL.

## 2020-10-26 DIAGNOSIS — E559 Vitamin D deficiency, unspecified: Secondary | ICD-10-CM | POA: Diagnosis not present

## 2020-10-26 DIAGNOSIS — Z79899 Other long term (current) drug therapy: Secondary | ICD-10-CM | POA: Diagnosis not present

## 2020-10-26 DIAGNOSIS — E7800 Pure hypercholesterolemia, unspecified: Secondary | ICD-10-CM | POA: Diagnosis not present

## 2020-10-26 DIAGNOSIS — R7302 Impaired glucose tolerance (oral): Secondary | ICD-10-CM | POA: Diagnosis not present

## 2021-01-25 DIAGNOSIS — M25561 Pain in right knee: Secondary | ICD-10-CM | POA: Diagnosis not present

## 2021-01-25 DIAGNOSIS — G8929 Other chronic pain: Secondary | ICD-10-CM | POA: Diagnosis not present

## 2021-01-25 DIAGNOSIS — M25562 Pain in left knee: Secondary | ICD-10-CM | POA: Diagnosis not present

## 2021-01-25 DIAGNOSIS — M25512 Pain in left shoulder: Secondary | ICD-10-CM | POA: Diagnosis not present

## 2021-02-19 DIAGNOSIS — E7800 Pure hypercholesterolemia, unspecified: Secondary | ICD-10-CM | POA: Diagnosis not present

## 2021-02-19 DIAGNOSIS — Z6841 Body Mass Index (BMI) 40.0 and over, adult: Secondary | ICD-10-CM | POA: Diagnosis not present

## 2021-02-19 DIAGNOSIS — E1165 Type 2 diabetes mellitus with hyperglycemia: Secondary | ICD-10-CM | POA: Diagnosis not present

## 2021-02-19 DIAGNOSIS — R030 Elevated blood-pressure reading, without diagnosis of hypertension: Secondary | ICD-10-CM | POA: Diagnosis not present

## 2021-02-19 DIAGNOSIS — Z79899 Other long term (current) drug therapy: Secondary | ICD-10-CM | POA: Diagnosis not present

## 2021-02-19 DIAGNOSIS — E559 Vitamin D deficiency, unspecified: Secondary | ICD-10-CM | POA: Diagnosis not present

## 2021-02-19 DIAGNOSIS — I10 Essential (primary) hypertension: Secondary | ICD-10-CM | POA: Diagnosis not present

## 2021-04-29 DIAGNOSIS — E7800 Pure hypercholesterolemia, unspecified: Secondary | ICD-10-CM | POA: Diagnosis not present

## 2021-04-29 DIAGNOSIS — I10 Essential (primary) hypertension: Secondary | ICD-10-CM | POA: Diagnosis not present

## 2021-04-29 DIAGNOSIS — E1165 Type 2 diabetes mellitus with hyperglycemia: Secondary | ICD-10-CM | POA: Diagnosis not present

## 2021-04-29 DIAGNOSIS — E559 Vitamin D deficiency, unspecified: Secondary | ICD-10-CM | POA: Diagnosis not present

## 2021-06-17 DIAGNOSIS — H5203 Hypermetropia, bilateral: Secondary | ICD-10-CM | POA: Diagnosis not present

## 2021-06-17 DIAGNOSIS — H2513 Age-related nuclear cataract, bilateral: Secondary | ICD-10-CM | POA: Diagnosis not present

## 2021-06-17 DIAGNOSIS — H40033 Anatomical narrow angle, bilateral: Secondary | ICD-10-CM | POA: Diagnosis not present

## 2021-06-22 DIAGNOSIS — R52 Pain, unspecified: Secondary | ICD-10-CM | POA: Diagnosis not present

## 2021-06-22 DIAGNOSIS — M79669 Pain in unspecified lower leg: Secondary | ICD-10-CM | POA: Diagnosis not present

## 2021-06-22 DIAGNOSIS — R1084 Generalized abdominal pain: Secondary | ICD-10-CM | POA: Diagnosis not present

## 2021-07-07 DIAGNOSIS — Z0100 Encounter for examination of eyes and vision without abnormal findings: Secondary | ICD-10-CM | POA: Diagnosis not present

## 2021-09-30 DIAGNOSIS — M129 Arthropathy, unspecified: Secondary | ICD-10-CM | POA: Diagnosis not present

## 2021-09-30 DIAGNOSIS — R5383 Other fatigue: Secondary | ICD-10-CM | POA: Diagnosis not present

## 2021-09-30 DIAGNOSIS — R7302 Impaired glucose tolerance (oral): Secondary | ICD-10-CM | POA: Diagnosis not present

## 2021-09-30 DIAGNOSIS — E7800 Pure hypercholesterolemia, unspecified: Secondary | ICD-10-CM | POA: Diagnosis not present

## 2021-09-30 DIAGNOSIS — Z79899 Other long term (current) drug therapy: Secondary | ICD-10-CM | POA: Diagnosis not present

## 2021-09-30 DIAGNOSIS — E559 Vitamin D deficiency, unspecified: Secondary | ICD-10-CM | POA: Diagnosis not present

## 2021-12-02 DIAGNOSIS — M5442 Lumbago with sciatica, left side: Secondary | ICD-10-CM | POA: Diagnosis not present

## 2021-12-02 DIAGNOSIS — M5441 Lumbago with sciatica, right side: Secondary | ICD-10-CM | POA: Diagnosis not present

## 2021-12-02 DIAGNOSIS — M25561 Pain in right knee: Secondary | ICD-10-CM | POA: Diagnosis not present

## 2021-12-02 DIAGNOSIS — G8929 Other chronic pain: Secondary | ICD-10-CM | POA: Diagnosis not present

## 2021-12-02 DIAGNOSIS — Z6841 Body Mass Index (BMI) 40.0 and over, adult: Secondary | ICD-10-CM | POA: Diagnosis not present

## 2021-12-02 DIAGNOSIS — G4700 Insomnia, unspecified: Secondary | ICD-10-CM | POA: Diagnosis not present

## 2021-12-02 DIAGNOSIS — E1165 Type 2 diabetes mellitus with hyperglycemia: Secondary | ICD-10-CM | POA: Diagnosis not present

## 2021-12-02 DIAGNOSIS — I10 Essential (primary) hypertension: Secondary | ICD-10-CM | POA: Diagnosis not present

## 2021-12-24 IMAGING — MG DIGITAL SCREENING BILAT W/ CAD
8 of 10 series · 8 of 10 positions shown · non-contrast
Comparison: Previous exam(s).

ACR Breast Density Category a: The breast tissue is almost entirely
fatty.

CLINICAL DATA: Screening.

EXAM:
DIGITAL SCREENING BILATERAL MAMMOGRAM WITH CAD

[R CV]
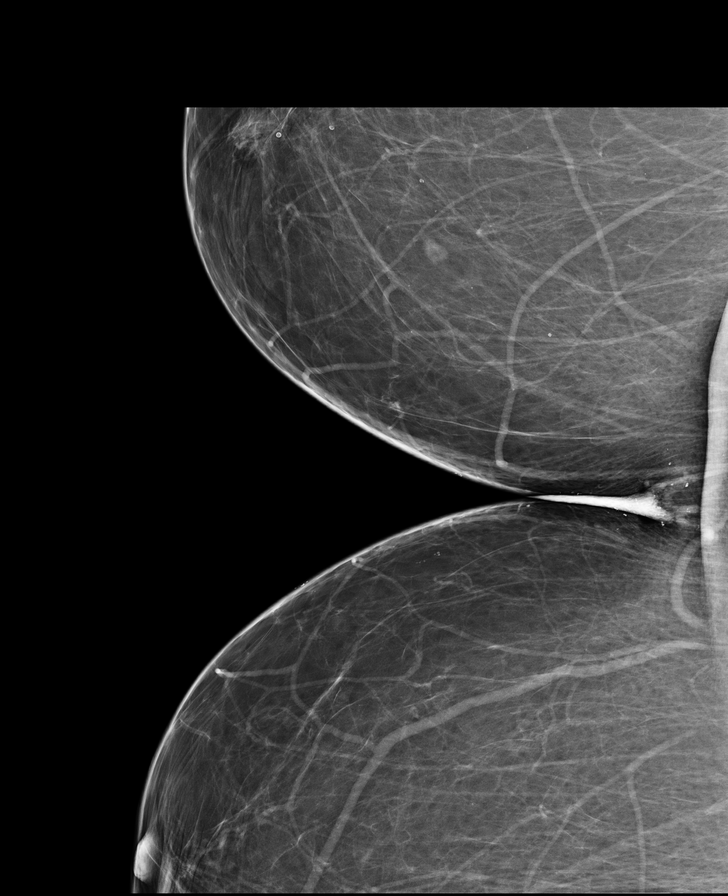

[L CV]
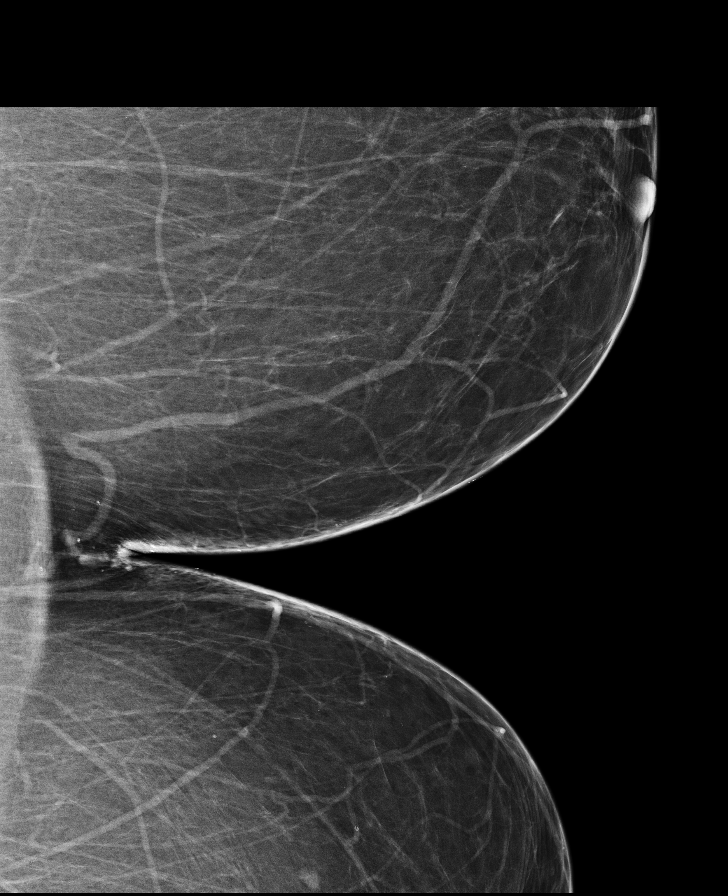

[L CC]
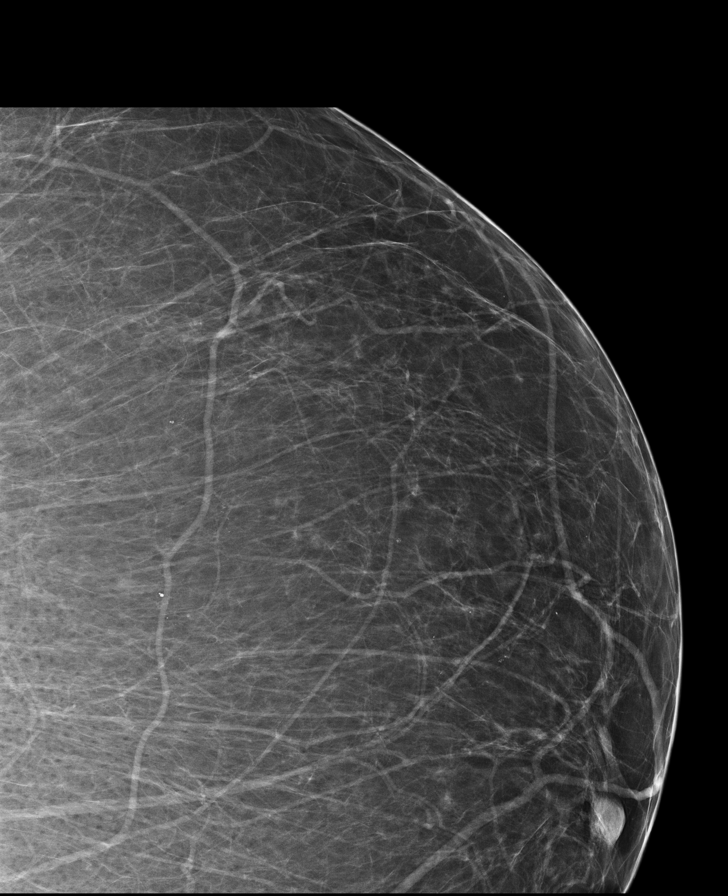

[R MLO (1 of 2)]
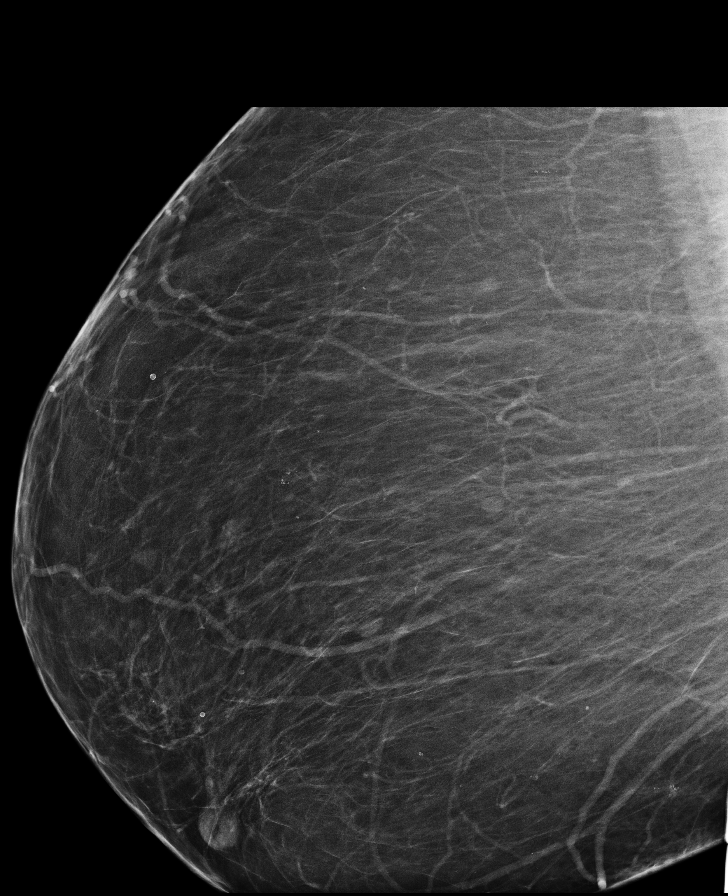

[L MLO (1 of 3)]
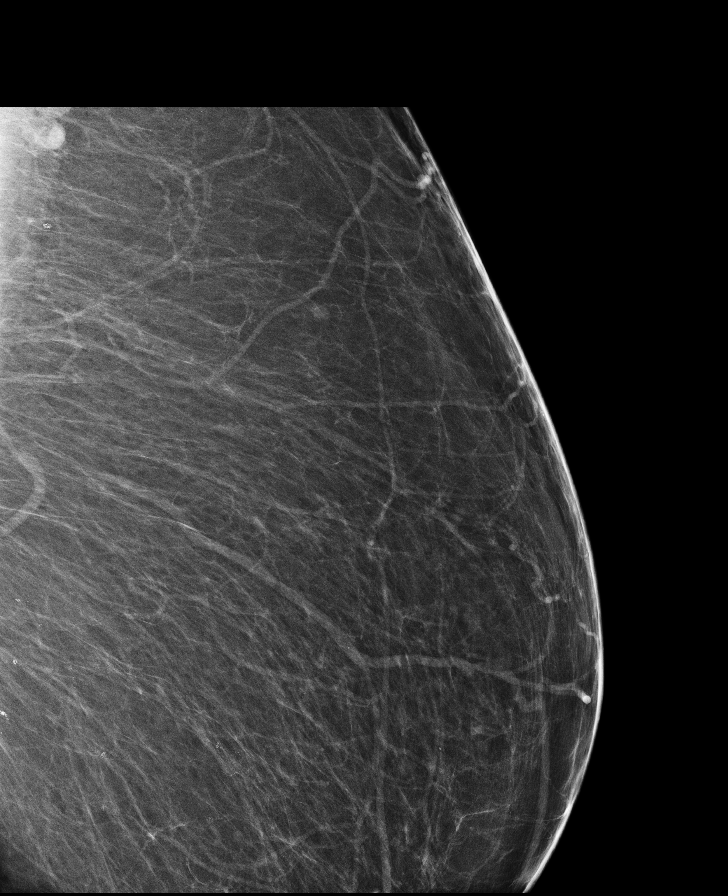

[L MLO (2 of 3)]
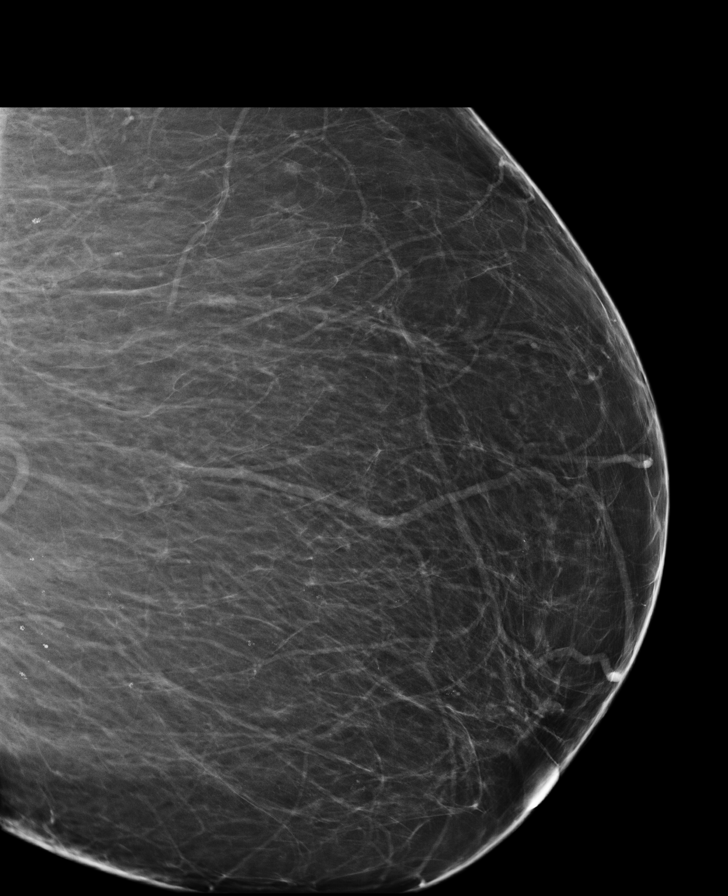

[R MLO (2 of 2)]
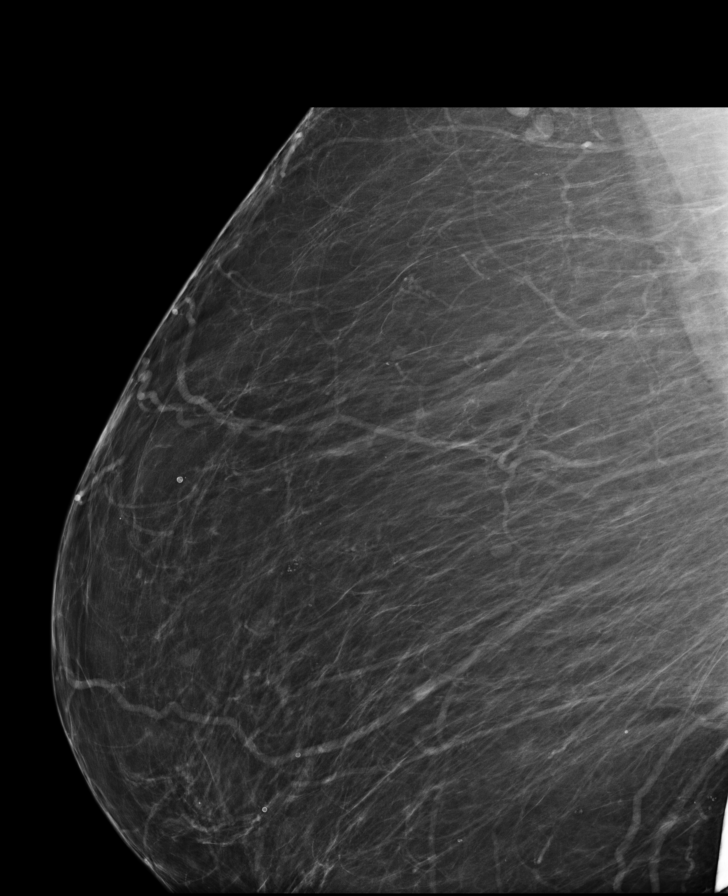

[L MLO (3 of 3)]
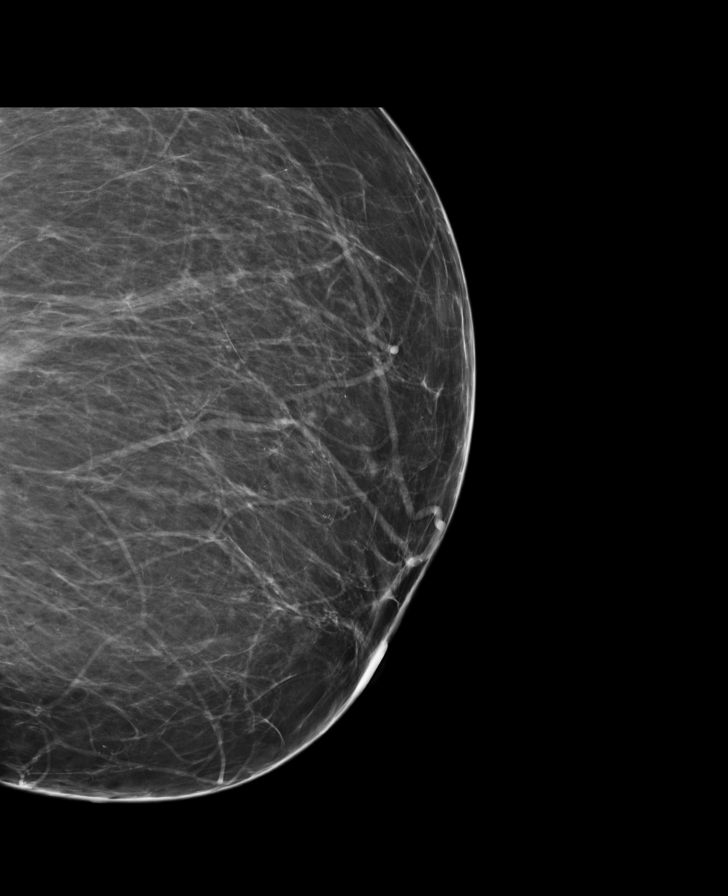

[8 of 10 positions shown; findings below may reference images not displayed]

FINDINGS: There are no findings suspicious for malignancy. Images were
processed with CAD.
IMPRESSION: No mammographic evidence of malignancy. A result letter of this
screening mammogram will be mailed directly to the patient.

RECOMMENDATION:
Screening mammogram in one year. (Code:[id number])

BI-RADS CATEGORY  1: Negative.

## 2022-01-05 DIAGNOSIS — R1032 Left lower quadrant pain: Secondary | ICD-10-CM | POA: Diagnosis not present

## 2022-01-05 DIAGNOSIS — F22 Delusional disorders: Secondary | ICD-10-CM | POA: Diagnosis not present

## 2022-03-10 ENCOUNTER — Other Ambulatory Visit: Payer: Self-pay | Admitting: Internal Medicine

## 2022-03-10 DIAGNOSIS — Z1231 Encounter for screening mammogram for malignant neoplasm of breast: Secondary | ICD-10-CM

## 2023-02-25 ENCOUNTER — Encounter (HOSPITAL_COMMUNITY): Payer: Self-pay | Admitting: *Deleted

## 2023-02-25 ENCOUNTER — Emergency Department (HOSPITAL_COMMUNITY)
Admission: EM | Admit: 2023-02-25 | Discharge: 2023-02-25 | Disposition: A | Discharge status: Home / Self Care | Payer: Medicare HMO | Attending: Emergency Medicine | Admitting: Emergency Medicine

## 2023-02-25 ENCOUNTER — Emergency Department (HOSPITAL_COMMUNITY): Payer: Medicare HMO

## 2023-02-25 ENCOUNTER — Other Ambulatory Visit: Payer: Self-pay

## 2023-02-25 DIAGNOSIS — Z7982 Long term (current) use of aspirin: Secondary | ICD-10-CM | POA: Diagnosis not present

## 2023-02-25 DIAGNOSIS — Z79899 Other long term (current) drug therapy: Secondary | ICD-10-CM | POA: Diagnosis not present

## 2023-02-25 DIAGNOSIS — M25512 Pain in left shoulder: Secondary | ICD-10-CM | POA: Diagnosis present

## 2023-02-25 DIAGNOSIS — I10 Essential (primary) hypertension: Secondary | ICD-10-CM | POA: Insufficient documentation

## 2023-02-25 MED ORDER — ACETAMINOPHEN 500 MG PO TABS
1000.0000 mg | ORAL_TABLET | Freq: Once | ORAL | Status: AC
  Administered 2023-02-25: 1000 mg via ORAL
  Filled 2023-02-25: qty 2

## 2023-02-25 MED ORDER — IBUPROFEN 400 MG PO TABS
400.0000 mg | ORAL_TABLET | Freq: Once | ORAL | Status: AC
  Administered 2023-02-25: 400 mg via ORAL
  Filled 2023-02-25: qty 1

## 2023-02-25 NOTE — ED Triage Notes (Signed)
The pt reports that she has a painful shoulder 3 weeks ago  she also wants to be seen for parasites in her head   she's homeless

## 2023-02-25 NOTE — ED Provider Notes (Signed)
[place] EMERGENCY DEPARTMENT AT [facility] Provider Note   CSN: [id number] Arrival date & time: 02/25/23  1743     History {Add pertinent medical, surgical, social history, OB history to HPI:1} Chief Complaint  Patient presents with   Shoulder Pain    Rachel Alvarado is a 57 y.o. female.   Shoulder Pain      Home Medications Prior to Admission medications   Medication Sig Start Date End Date Taking? Authorizing Provider  aspirin 81 MG EC tablet Take 81 mg by mouth daily. Swallow whole.    [provider]  atorvastatin (LIPITOR) 20 MG tablet Take 20 mg by mouth daily. 03/12/18   [provider]  benztropine (COGENTIN) 0.5 MG tablet Take 1 tablet (0.5 mg total) by mouth 2 (two) times daily. 06/26/18   Malvin Johns, MD  docusate sodium (COLACE) 100 MG capsule Take 100 mg by mouth daily.     [provider]  haloperidol (HALDOL) 10 MG tablet Take 1 tablet (10 mg total) by mouth at bedtime. Patient not taking: Reported on 08/19/2018 06/26/18   Malvin Johns, MD  ibuprofen (ADVIL,MOTRIN) 800 MG tablet Take 1 tablet (800 mg total) by mouth 3 (three) times daily. Patient not taking: Reported on 08/19/2018 02/11/18   Fayrene Helper, PA-C  losartan (COZAAR) 50 MG tablet Take 50 mg by mouth daily. 03/12/18   [provider]  meloxicam (MOBIC) 15 MG tablet Take 15 mg by mouth daily. 03/13/18   [provider]  polyethylene glycol (MIRALAX / GLYCOLAX) packet Take 17 g by mouth daily. Patient not taking: Reported on 06/24/2018 04/16/16   Gerhard Munch, MD  senna-docusate (SENOKOT-S) 8.6-50 MG tablet Take 1 tablet by mouth daily. Patient not taking: Reported on 06/24/2018 04/16/16   Gerhard Munch, MD  topiramate (TOPAMAX) 50 MG tablet Take 1 tablet (50 mg total) by mouth 3 (three) times daily. Patient not taking: Reported on 08/19/2018 06/26/18 06/26/19  Malvin Johns, MD      Allergies    Sulfa antibiotics    Review of Systems   Review of  Systems  Physical Exam Updated Vital Signs BP 123/65 (BP Location: Right Arm)   Pulse 90   Temp 98 F (36.7 C)   Resp 16   Ht 5\' 4"  (1.626 m)   Wt 127 kg   SpO2 98%   BMI 48.06 kg/m  Physical Exam  ED Results / Procedures / Treatments   Labs (all labs ordered are listed, but only abnormal results are displayed) Labs Reviewed - No data to display  EKG None  Radiology No results found.  Procedures Procedures  {Document cardiac monitor, telemetry assessment procedure when appropriate:1}  Medications Ordered in ED Medications - No data to display  ED Course/ Medical Decision Making/ A&P   {   Click here for ABCD2, HEART and other calculatorsREFRESH Note before signing :1}                              Medical Decision Making  ***  {Document critical care time when appropriate:1} {Document review of labs and clinical decision tools ie heart score, Chads2Vasc2 etc:1}  {Document your independent review of radiology images, and any outside records:1} {Document your discussion with family members, caretakers, and with consultants:1} {Document social determinants of health affecting pt's care:1} {Document your decision making why or why not admission, treatments were needed:1} Final Clinical Impression(s) / ED Diagnoses Final diagnoses:  None    Rx / DC Orders ED Discharge Orders     None

## 2023-02-25 NOTE — Discharge Instructions (Signed)
You may take over-the-counter Tylenol and ibuprofen as needed for your shoulder pain.  You are given shoulder exercises at the time of discharge to help with your pain.  Please ensure that you are doing these at least twice daily.  You may use the sling given to your care for comfort as needed.  Please return to the Emergency Department if you develop chest pain, shortness of breath, numbness in your left arm.

## 2023-04-12 ENCOUNTER — Telehealth: Payer: Self-pay | Admitting: *Deleted

## 2023-04-12 ENCOUNTER — Telehealth (HOSPITAL_BASED_OUTPATIENT_CLINIC_OR_DEPARTMENT_OTHER): Payer: Self-pay

## 2023-04-12 NOTE — Telephone Encounter (Signed)
Called patient and was unable to leave her a message due to no voice mail set up.

## 2023-04-12 NOTE — Telephone Encounter (Signed)
-----   Message from Michaell Cowing sent at 04/12/2023 11:53 AM EST ----- Regarding: RE: Scheduling Called patient and was unable to leave her a message due to no voice mail set up. ----- Message ----- From: Acquanetta Sit, NT Sent: 04/12/2023  11:20 AM EST To: Vanessa G [place]; Harrie Jeans, RN; # Subject: Scheduling                                     Good morning. This patient needs to be scheduled for an annual and mammo as soon as possible per patient request in [place]. Due to lack of transportation and having to arrange a ride she would like to stay in [place] to make it easier for transportation to and from appointments. If someone could give her a call today to get her scheduled she would really appreciate that. Thank you all for your time.

## 2023-04-12 NOTE — Telephone Encounter (Signed)
Advised patient to contact [facility] to schedule or whatever office is on a bus line per patient request.

## 2023-05-03 ENCOUNTER — Telehealth (HOSPITAL_BASED_OUTPATIENT_CLINIC_OR_DEPARTMENT_OTHER): Payer: Self-pay | Admitting: *Deleted

## 2023-05-03 NOTE — Telephone Encounter (Signed)
-----  Message from Michaell Cowing sent at 04/12/2023 11:53 AM EST ----- Regarding: RE: Scheduling Called patient and was unable to leave her a message due to no voice mail set up. ----- Message ----- From: Acquanetta Sit, NT Sent: 04/12/2023  11:20 AM EST To: Vanessa G [place]; Harrie Jeans, RN; # Subject: Scheduling                                     Good morning. This patient needs to be scheduled for an annual and mammo as soon as possible per patient request in [place]. Due to lack of transportation and having to arrange a ride she would like to stay in [place] to make it easier for transportation to and from appointments. If someone could give her a call today to get her scheduled she would really appreciate that. Thank you all for your time.

## 2023-05-03 NOTE — Telephone Encounter (Signed)
Called pt to set up appt for GYN visit. Pt states that she already has appt scheduled with Dr. Ihor Dow. Advised that he is Primary care and will not do breast and pelvic exam. Pt states that is ok. Her insurance is requesting that she establish PCP.

## 2023-05-09 ENCOUNTER — Other Ambulatory Visit: Payer: Self-pay | Admitting: Internal Medicine

## 2023-05-09 DIAGNOSIS — Z1231 Encounter for screening mammogram for malignant neoplasm of breast: Secondary | ICD-10-CM

## 2023-05-25 ENCOUNTER — Ambulatory Visit: Payer: No Typology Code available for payment source

## 2023-05-26 ENCOUNTER — Ambulatory Visit (HOSPITAL_BASED_OUTPATIENT_CLINIC_OR_DEPARTMENT_OTHER): Payer: No Typology Code available for payment source | Admitting: Family Medicine

## 2023-06-22 DIAGNOSIS — E7800 Pure hypercholesterolemia, unspecified: Secondary | ICD-10-CM | POA: Diagnosis not present

## 2023-06-22 DIAGNOSIS — E559 Vitamin D deficiency, unspecified: Secondary | ICD-10-CM | POA: Diagnosis not present

## 2023-06-22 DIAGNOSIS — R5383 Other fatigue: Secondary | ICD-10-CM | POA: Diagnosis not present

## 2023-06-22 DIAGNOSIS — M129 Arthropathy, unspecified: Secondary | ICD-10-CM | POA: Diagnosis not present

## 2023-06-22 DIAGNOSIS — E119 Type 2 diabetes mellitus without complications: Secondary | ICD-10-CM | POA: Diagnosis not present

## 2023-06-28 DIAGNOSIS — H524 Presbyopia: Secondary | ICD-10-CM | POA: Diagnosis not present

## 2023-06-28 DIAGNOSIS — H5203 Hypermetropia, bilateral: Secondary | ICD-10-CM | POA: Diagnosis not present

## 2023-06-30 ENCOUNTER — Ambulatory Visit

## 2023-07-03 ENCOUNTER — Ambulatory Visit

## 2023-07-06 ENCOUNTER — Ambulatory Visit

## 2023-07-11 ENCOUNTER — Ambulatory Visit

## 2023-07-11 DIAGNOSIS — M5441 Lumbago with sciatica, right side: Secondary | ICD-10-CM | POA: Diagnosis not present

## 2023-09-15 ENCOUNTER — Ambulatory Visit

## 2023-10-03 ENCOUNTER — Ambulatory Visit: Admitting: Orthopaedic Surgery

## 2023-10-19 ENCOUNTER — Ambulatory Visit

## 2023-10-20 ENCOUNTER — Ambulatory Visit: Admitting: Orthopaedic Surgery

## 2023-11-10 ENCOUNTER — Ambulatory Visit: Admitting: Orthopaedic Surgery

## 2023-11-21 ENCOUNTER — Ambulatory Visit: Admitting: Orthopaedic Surgery

## 2023-11-24 ENCOUNTER — Ambulatory Visit: Admitting: Orthopaedic Surgery

## 2023-11-24 ENCOUNTER — Other Ambulatory Visit: Payer: Self-pay

## 2023-11-24 ENCOUNTER — Other Ambulatory Visit (INDEPENDENT_AMBULATORY_CARE_PROVIDER_SITE_OTHER)

## 2023-11-24 DIAGNOSIS — G8929 Other chronic pain: Secondary | ICD-10-CM

## 2023-11-24 DIAGNOSIS — M25512 Pain in left shoulder: Secondary | ICD-10-CM

## 2023-11-24 NOTE — Progress Notes (Signed)
 Office Visit Note   Patient: Rachel Alvarado           Date of Birth: Dec 25, 1965           MRN: [id number] Visit Date: 11/24/2023              Requested by: Austin Mutton, MD [address] [place],  [place] [address] PCP: Austin Mutton, MD   Assessment & Plan: Visit Diagnoses:  1. Chronic left shoulder pain     Plan: History of Present Illness Rachel Alvarado is a 58 year old female who presents with left shoulder pain and suspected dislocation. She was referred to the orthopedic specialist for evaluation of her shoulder condition.  She describes her left shoulder as feeling like it is slipping out of the socket, referring to it as 'dislocated a little bit.' She experiences pain when attempting to lift her arm straight up, stating 'it won't go, it hurts.' Sometimes the shoulder 'pops like it's trying to pop back in place but it's not doing it.'  The shoulder issue has persisted for almost a year and is worsening. She attributes the problem to sleeping on it all night due to her weight. She has not experienced a dislocation before and denies any prior injury to the shoulder. She has not required anyone to pop it back in place but believes it needs to be 'popped back in.'  During the review of symptoms, she reports pain and difficulty with abduction of the shoulder. No current pain when the shoulder is moved during the examination.  Patient stated that her shoulder is currently dislocated.  Physical Exam MUSCULOSKELETAL: Left shoulder pain with abduction and guarding.  With continued gentle passive range of motion I was able to achieve full range of motion.  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 Differential includes rotator cuff tendinitis and bursitis. - Schedule cortisone  injection for left shoulder with Dr. Burnetta.  Total face to face encounter time was greater than 45 minutes and over half of this time was spent in counseling and/or coordination of care.   Follow-Up Instructions: No follow-ups on file.   Orders:  Orders Placed This Encounter  Procedures   XR Shoulder Left   No orders of the defined types were placed in this encounter.    Subjective: Chief Complaint  Patient presents with   Left Shoulder - Pain    HPI  Review of Systems  Constitutional: Negative.   HENT: Negative.    Eyes: Negative.   Respiratory: Negative.    Cardiovascular: Negative.   Endocrine: Negative.   Musculoskeletal:  Positive for arthralgias.  Neurological: Negative.   Hematological: Negative.   Psychiatric/Behavioral: Negative.    All other systems reviewed and are negative.    Objective: Vital Signs: There were no vitals taken for this visit.  Physical Exam Vitals and nursing note reviewed.  Constitutional:      Appearance: She is well-developed.  HENT:     Head: Atraumatic.     Nose: Nose normal.  Eyes:     Extraocular Movements: Extraocular movements intact.  Cardiovascular:     Pulses: Normal pulses.  Pulmonary:     Effort: Pulmonary effort is normal.  Abdominal:     Palpations: Abdomen is soft.  Musculoskeletal:     Cervical back: Neck supple.  Skin:  General: Skin is warm.     Capillary Refill: Capillary refill takes less than 2 seconds.  Neurological:     Mental Status: She is alert. Mental status is at baseline.  Psychiatric:        Behavior: Behavior normal.        Thought Content: Thought content normal.        Judgment: Judgment normal.     Ortho Exam  Specialty Comments:  No specialty comments available.  Imaging: XR Shoulder Left Result Date: 11/24/2023 X-rays of the left shoulder somewhat obscured by soft tissue envelope.  Mild degenerative changes.  No acute abnormalities.  Glenohumeral joint is  located.    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/05/2017   Bilateral low back pain without sciatica 07/02/2015   Knee pain, bilateral 07/02/2015   Breast pain, right 11/05/2013   Dizziness and giddiness 05/28/2012   Screening for STDs (sexually transmitted diseases) 04/24/2012   Schizophrenia (HCC) 04/03/2012   History of sleep apnea 04/03/2012   Arthritis of both knees 04/03/2012   Anxiety and depression 04/03/2012   Chronic left shoulder pain 04/03/2012   Musculoskeletal chest pain 04/03/2012   Preventative health care 04/03/2012   Morbid obesity with BMI of 45.0-49.9, adult (HCC) 04/03/2012   Past Medical History:  Diagnosis Date   Anemia    Anxiety    Arthritis    Chronic back pain    COPD (chronic obstructive pulmonary disease) (HCC)    Depression    Hypertension    Panic attacks    Schizophrenia (HCC)    Sleep apnea     Family History  Problem Relation Age of Onset   Alcohol  abuse Father    Heart disease Father    Diabetes Mellitus II Father    Diabetes Father    Hypertension Father    Osteoporosis Mother    Heart disease Mother    Diabetes Mellitus II Brother    Stroke Sister     Past Surgical History:  Procedure Laterality Date   CESAREAN SECTION     CHOLECYSTECTOMY     PARTIAL HYSTERECTOMY     Social History   Occupational History   Not on file  Tobacco Use   Smoking status: Former    Current packs/day: 0.00    Types: Cigarettes    Quit date: 02/03/2012    Years since quitting: 11.8   Smokeless tobacco: Never  Substance and Sexual Activity   Alcohol  use: No   Drug use: No    Types: Cocaine, Marijuana    Comment:  cocaine in a blue moon, marijuana twice a wk, pt states she stopped using last month   Sexual activity: Never

## 2024-01-22 ENCOUNTER — Encounter: Payer: Self-pay | Admitting: Radiology

## 2024-02-01 ENCOUNTER — Other Ambulatory Visit: Payer: Self-pay | Admitting: Family Medicine

## 2024-02-01 DIAGNOSIS — Z122 Encounter for screening for malignant neoplasm of respiratory organs: Secondary | ICD-10-CM

## 2024-02-01 DIAGNOSIS — Z87891 Personal history of nicotine dependence: Secondary | ICD-10-CM

## 2024-04-03 ENCOUNTER — Other Ambulatory Visit: Payer: Self-pay | Admitting: Family Medicine

## 2024-04-03 DIAGNOSIS — Z0000 Encounter for general adult medical examination without abnormal findings: Secondary | ICD-10-CM

## 2024-04-03 DIAGNOSIS — Z87891 Personal history of nicotine dependence: Secondary | ICD-10-CM
